# Patient Record
Sex: Male | Born: 1975 | Race: Black or African American | Hispanic: No | Marital: Single | Smoking: Current every day smoker
Health system: Southern US, Community
[De-identification: ages and names within clinical notes are randomized; demographics above are authoritative.]

## PROBLEM LIST (undated history)

## (undated) HISTORY — PX: INCISION AND DRAINAGE: SHX5863

---

## 2005-02-24 ENCOUNTER — Emergency Department (HOSPITAL_COMMUNITY): Admission: EM | Admit: 2005-02-24 | Discharge: 2005-02-24 | Payer: Self-pay | Admitting: *Deleted

## 2005-06-02 ENCOUNTER — Emergency Department (HOSPITAL_COMMUNITY): Admission: EM | Admit: 2005-06-02 | Discharge: 2005-06-02 | Payer: Self-pay | Admitting: Emergency Medicine

## 2005-09-03 ENCOUNTER — Emergency Department (HOSPITAL_COMMUNITY): Admission: EM | Admit: 2005-09-03 | Discharge: 2005-09-03 | Payer: Self-pay | Admitting: Emergency Medicine

## 2007-07-23 ENCOUNTER — Ambulatory Visit: Payer: Self-pay | Admitting: Internal Medicine

## 2007-12-16 ENCOUNTER — Emergency Department (HOSPITAL_COMMUNITY): Admission: EM | Admit: 2007-12-16 | Discharge: 2007-12-16 | Payer: Self-pay | Admitting: Emergency Medicine

## 2011-05-04 ENCOUNTER — Inpatient Hospital Stay (INDEPENDENT_AMBULATORY_CARE_PROVIDER_SITE_OTHER)
Admission: RE | Admit: 2011-05-04 | Discharge: 2011-05-04 | Disposition: A | Discharge status: Home / Self Care | Payer: Self-pay | Source: Ambulatory Visit | Attending: Family Medicine | Admitting: Family Medicine

## 2011-05-04 DIAGNOSIS — K089 Disorder of teeth and supporting structures, unspecified: Secondary | ICD-10-CM

## 2011-05-04 DIAGNOSIS — S025XXA Fracture of tooth (traumatic), initial encounter for closed fracture: Secondary | ICD-10-CM

## 2011-08-10 LAB — DIFFERENTIAL
Basophils Absolute: 0
Basophils Relative: 0
Eosinophils Absolute: 0
Eosinophils Relative: 1
Lymphocytes Relative: 3 — ABNORMAL LOW
Lymphs Abs: 0.3 — ABNORMAL LOW
Monocytes Absolute: 0.4
Monocytes Relative: 5
Neutro Abs: 7.5
Neutrophils Relative %: 91 — ABNORMAL HIGH

## 2011-08-10 LAB — CBC
HCT: 49.5
Hemoglobin: 17.2 — ABNORMAL HIGH
MCHC: 34.8
MCV: 89.3
Platelets: 198
RBC: 5.54
RDW: 14.4
WBC: 8.2

## 2011-08-10 LAB — URINALYSIS, ROUTINE W REFLEX MICROSCOPIC
Bilirubin Urine: NEGATIVE
Glucose, UA: NEGATIVE
Hgb urine dipstick: NEGATIVE
Nitrite: NEGATIVE
Protein, ur: NEGATIVE
Specific Gravity, Urine: 1.032 — ABNORMAL HIGH
Urobilinogen, UA: 0.2
pH: 7

## 2011-08-10 LAB — COMPREHENSIVE METABOLIC PANEL
ALT: 21
AST: 27
Albumin: 4.3
Alkaline Phosphatase: 80
BUN: 15
CO2: 27
Calcium: 9.3
Chloride: 97
Creatinine, Ser: 1.03
GFR calc Af Amer: >60
GFR calc non Af Amer: >60
Glucose, Bld: 125 — ABNORMAL HIGH
Potassium: 4.2
Sodium: 134 — ABNORMAL LOW
Total Bilirubin: 1
Total Protein: 7.8

## 2011-08-10 LAB — LIPASE, BLOOD: Lipase: 17

## 2012-03-18 ENCOUNTER — Emergency Department (HOSPITAL_COMMUNITY)
Admission: EM | Admit: 2012-03-18 | Discharge: 2012-03-19 | Disposition: A | Discharge status: Home / Self Care | Payer: Self-pay | Attending: Emergency Medicine | Admitting: Emergency Medicine

## 2012-03-18 ENCOUNTER — Encounter (HOSPITAL_COMMUNITY): Payer: Self-pay | Admitting: Emergency Medicine

## 2012-03-18 DIAGNOSIS — R000 Tachycardia, unspecified: Secondary | ICD-10-CM | POA: Insufficient documentation

## 2012-03-18 DIAGNOSIS — R51 Headache: Secondary | ICD-10-CM | POA: Insufficient documentation

## 2012-03-18 DIAGNOSIS — K1379 Other lesions of oral mucosa: Secondary | ICD-10-CM

## 2012-03-18 DIAGNOSIS — R221 Localized swelling, mass and lump, neck: Secondary | ICD-10-CM | POA: Insufficient documentation

## 2012-03-18 DIAGNOSIS — R1310 Dysphagia, unspecified: Secondary | ICD-10-CM | POA: Insufficient documentation

## 2012-03-18 DIAGNOSIS — R220 Localized swelling, mass and lump, head: Secondary | ICD-10-CM | POA: Insufficient documentation

## 2012-03-18 DIAGNOSIS — J029 Acute pharyngitis, unspecified: Secondary | ICD-10-CM | POA: Insufficient documentation

## 2012-03-18 DIAGNOSIS — K1370 Unspecified lesions of oral mucosa: Secondary | ICD-10-CM | POA: Insufficient documentation

## 2012-03-18 LAB — RAPID STREP SCREEN (MED CTR MEBANE ONLY): Streptococcus, Group A Screen (Direct): NEGATIVE

## 2012-03-18 MED ORDER — DEXAMETHASONE 10 MG/ML FOR PEDIATRIC ORAL USE
10.0000 mg | Freq: Once | INTRAMUSCULAR | Status: AC
  Administered 2012-03-18: 10 mg via ORAL
  Filled 2012-03-18: qty 1

## 2012-03-18 MED ORDER — OXYCODONE-ACETAMINOPHEN 5-325 MG/5ML PO SOLN
5.0000 mL | ORAL | Status: DC | PRN
  Administered 2012-03-18: 5 mL via ORAL
  Filled 2012-03-18: qty 5

## 2012-03-18 NOTE — ED Provider Notes (Addendum)
History     CSN: [id number]  Arrival date & time 03/18/12  2036   First MD Initiated Contact with Patient 03/18/12 2230      Chief Complaint  Patient presents with  . Sore Throat    (Consider location/radiation/quality/duration/timing/severity/associated sxs/prior treatment) HPI Comments: Patient has a sore throat, states it is hard for him to swallow.  His not taking any over-the-counter medication except NyQuil without relief.  Today, developed a headache, as well  Patient is a 36 y.o. male presenting with pharyngitis. The history is provided by the patient.  Sore Throat This is a new problem. The current episode started in the past 7 days. The problem occurs constantly. The problem has been unchanged. Associated symptoms include headaches and a sore throat. Pertinent negatives include no chills or fever.    No past medical history on file.  No past surgical history on file.  No family history on file.  History  Substance Use Topics  . Smoking status: Not on file  . Smokeless tobacco: Not on file  . Alcohol Use: Not on file      Review of Systems  Constitutional: Negative for fever and chills.  HENT: Positive for sore throat and trouble swallowing. Negative for ear pain and rhinorrhea.   Neurological: Positive for headaches. Negative for dizziness.    Allergies  Review of patient's allergies indicates no known allergies.  Home Medications   Current Outpatient Rx  Name Route Sig Dispense Refill  . ACETAMINOPHEN 160 MG/5ML PO SUSP Oral Take 960 mg/kg by mouth 2 (two) times daily as needed. For pain    . PRESCRIPTION MEDICATION Oral Take 5 mLs by mouth daily as needed. For pain  Tylenol 3 with codeine suspension.      BP 127/81  Pulse 105  Temp(Src) 100.6 F (38.1 C) (Oral)  Resp 20  SpO2 99%  Physical Exam  Constitutional: He appears well-developed.  HENT:  Mouth/Throat: Uvula is midline and mucous membranes are normal. Uvula swelling present. No  oropharyngeal exudate, posterior oropharyngeal edema, posterior oropharyngeal erythema or tonsillar abscesses.  Neck: Normal range of motion.  Cardiovascular: Tachycardia present.   Pulmonary/Chest: Effort normal.  Abdominal: Soft.  Musculoskeletal: Normal range of motion.  Neurological: He is alert.  Skin: Skin is warm and dry. No rash noted.    ED Course  Procedures (including critical care time)   Labs Reviewed  RAPID STREP SCREEN   No results found.   No diagnosis found.    MDM   pharyngitis with a negative strep test will try to get patient's symptoms under control by administering dexamethasone for the swelling, as well as Roxicodone liquid and then assess patient's ability to swallow  After Roxicodone and dexamethasone.  Patient is able to open his mouth fully.  Uvula is decreased in size is able to tolerate by mouth fluids without difficulty      Arman Filter, NP 03/18/12 2320  Arman Filter, NP 03/19/12 [phone]

## 2012-03-18 NOTE — ED Provider Notes (Signed)
Medical screening examination/treatment/procedure(s) were performed by non-physician practitioner and as supervising physician I was immediately available for consultation/collaboration.  Ethelda Chick, MD 03/18/12 [phone]

## 2012-03-18 NOTE — ED Notes (Signed)
Throat pain starting Saturday; febrile. Headache, body aches. PT reports difficulty eatting and swallowing. Throat white patchy and enlarged

## 2012-03-19 MED ORDER — HYDROCODONE-ACETAMINOPHEN 5-500 MG PO TABS: 1.0000 | ORAL_TABLET | Freq: Four times a day (QID) | ORAL | Status: AC | PRN

## 2012-03-19 MED ORDER — AZITHROMYCIN 250 MG PO TABS: 250.0000 mg | ORAL_TABLET | Freq: Every day | ORAL | Status: AC

## 2012-03-19 MED ORDER — AZITHROMYCIN 250 MG PO TABS
500.0000 mg | ORAL_TABLET | Freq: Once | ORAL | Status: AC
  Administered 2012-03-19: 500 mg via ORAL
  Filled 2012-03-19: qty 2

## 2012-03-19 NOTE — ED Provider Notes (Signed)
Medical screening examination/treatment/procedure(s) were performed by non-physician practitioner and as supervising physician I was immediately available for consultation/collaboration.  Sunnie Nielsen, MD 03/19/12 [phone]

## 2012-03-19 NOTE — Discharge Instructions (Signed)
Pharyngitis, Viral and Bacterial Pharyngitis is soreness (inflammation) or infection of the pharynx. It is also called a sore throat. CAUSES  Most sore throats are caused by viruses and are part of a cold. However, some sore throats are caused by strep and other bacteria. Sore throats can also be caused by post nasal drip from draining sinuses, allergies and sometimes from sleeping with an open mouth. Infectious sore throats can be spread from person to person by coughing, sneezing and sharing cups or eating utensils. TREATMENT  Sore throats that are viral usually last 3-4 days. Viral illness will get better without medications (antibiotics). Strep throat and other bacterial infections will usually begin to get better about 24-48 hours after you begin to take antibiotics. HOME CARE INSTRUCTIONS   If the caregiver feels there is a bacterial infection or if there is a positive strep test, they will prescribe an antibiotic. The full course of antibiotics must be taken. If the full course of antibiotic is not taken, you or your child may become ill again. If you or your child has strep throat and do not finish all of the medication, serious heart or kidney diseases may develop.   Drink enough water and fluids to keep your urine clear or pale yellow.   Only take over-the-counter or prescription medicines for pain, discomfort or fever as directed by your caregiver.   Get lots of rest.   Gargle with salt water ( tsp. of salt in a glass of water) as often as every 1-2 hours as you need for comfort.   Hard candies may soothe the throat if individual is not at risk for choking. Throat sprays or lozenges may also be used.  SEEK MEDICAL CARE IF:   Large, tender lumps in the neck develop.   A rash develops.   Green, yellow-brown or bloody sputum is coughed up.   Your baby is older than 3 months with a rectal temperature of 100.5 F (38.1 C) or higher for more than 1 day.  SEEK IMMEDIATE MEDICAL CARE  IF:   A stiff neck develops.   You or your child are drooling or unable to swallow liquids.   You or your child are vomiting, unable to keep medications or liquids down.   You or your child has severe pain, unrelieved with recommended medications.   You or your child are having difficulty breathing (not due to stuffy nose).   You or your child are unable to fully open your mouth.   You or your child develop redness, swelling, or severe pain anywhere on the neck.   You have a fever.   Your baby is older than 3 months with a rectal temperature of 102 F (38.9 C) or higher.   Your baby is 3 months old or younger with a rectal temperature of 100.4 F (38 C) or higher.  MAKE SURE YOU:   Understand these instructions.   Will watch your condition.   Will get help right away if you are not doing well or get worse.  Document Released: 11/06/2005 Document Revised: 10/26/2011 Document Reviewed: 02/03/2008 [employer] Patient Information 2012 [employer]Salt Water Gargle This solution will help make your mouth and throat feel better. HOME CARE INSTRUCTIONS   Mix 1 teaspoon of salt in 8 ounces of warm water.   Gargle with this solution as much or often as you need or as directed. Swish and gargle gently if you have any sores or wounds in your mouth.   Do not   swallow this mixture.  Document Released: 08/10/2004 Document Revised: 10/26/2011 Document Reviewed: 01/01/2009 [facility] Patient Information 2012 [place], [place]. Your rapid strep test was negative, but is being cultured, started on antibiotic.  Please take as directed.  We will not need any further medication for swelling, try to stay dehydrated, sipping cool liquids frequently

## 2012-10-28 ENCOUNTER — Emergency Department (HOSPITAL_COMMUNITY): Payer: No Typology Code available for payment source

## 2012-10-28 ENCOUNTER — Encounter (HOSPITAL_COMMUNITY): Payer: Self-pay | Admitting: *Deleted

## 2012-10-28 ENCOUNTER — Emergency Department (HOSPITAL_COMMUNITY)
Admission: EM | Admit: 2012-10-28 | Discharge: 2012-10-28 | Disposition: A | Discharge status: Home / Self Care | Payer: No Typology Code available for payment source | Attending: Emergency Medicine | Admitting: Emergency Medicine

## 2012-10-28 DIAGNOSIS — Y9389 Activity, other specified: Secondary | ICD-10-CM | POA: Insufficient documentation

## 2012-10-28 DIAGNOSIS — F17200 Nicotine dependence, unspecified, uncomplicated: Secondary | ICD-10-CM | POA: Insufficient documentation

## 2012-10-28 DIAGNOSIS — S139XXA Sprain of joints and ligaments of unspecified parts of neck, initial encounter: Secondary | ICD-10-CM | POA: Insufficient documentation

## 2012-10-28 DIAGNOSIS — S161XXA Strain of muscle, fascia and tendon at neck level, initial encounter: Secondary | ICD-10-CM

## 2012-10-28 DIAGNOSIS — Y92410 Unspecified street and highway as the place of occurrence of the external cause: Secondary | ICD-10-CM | POA: Insufficient documentation

## 2012-10-28 DIAGNOSIS — S39012A Strain of muscle, fascia and tendon of lower back, initial encounter: Secondary | ICD-10-CM

## 2012-10-28 DIAGNOSIS — S335XXA Sprain of ligaments of lumbar spine, initial encounter: Secondary | ICD-10-CM | POA: Insufficient documentation

## 2012-10-28 LAB — POCT I-STAT, CHEM 8
BUN: 11 mg/dL (ref 6–23)
Calcium, Ion: 1.16 mmol/L (ref 1.12–1.23)
Chloride: 108 mEq/L (ref 96–112)
Creatinine, Ser: 0.9 mg/dL (ref 0.50–1.35)
Glucose, Bld: 108 mg/dL — ABNORMAL HIGH (ref 70–99)
HCT: 45 % (ref 39.0–52.0)
Hemoglobin: 15.3 g/dL (ref 13.0–17.0)
Potassium: 4.1 mEq/L (ref 3.5–5.1)
Sodium: 142 mEq/L (ref 135–145)
TCO2: 25 mmol/L (ref 0–100)

## 2012-10-28 MED ORDER — HYDROCODONE-ACETAMINOPHEN 5-325 MG PO TABS: 1.0000 | ORAL_TABLET | Freq: Four times a day (QID) | ORAL | Status: DC | PRN

## 2012-10-28 MED ORDER — MORPHINE SULFATE 4 MG/ML IJ SOLN
4.0000 mg | Freq: Once | INTRAMUSCULAR | Status: AC
  Administered 2012-10-28: 4 mg via INTRAVENOUS
  Filled 2012-10-28: qty 1

## 2012-10-28 MED ORDER — IOHEXOL 300 MG/ML  SOLN
100.0000 mL | Freq: Once | INTRAMUSCULAR | Status: AC | PRN
  Administered 2012-10-28: 100 mL via INTRAVENOUS

## 2012-10-28 MED ORDER — NAPROXEN 500 MG PO TABS: 500.0000 mg | ORAL_TABLET | Freq: Two times a day (BID) | ORAL | Status: DC

## 2012-10-28 MED ORDER — SODIUM CHLORIDE 0.9 % IV SOLN
INTRAVENOUS | Status: DC
  Administered 2012-10-28: 125 mL/h via INTRAVENOUS
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
## 2012-10-28 NOTE — Progress Notes (Signed)
Pt listed with no insurance coverage CM and [facility] liaison spoke with him Pt offered services to assist with finding a [place] self pay provider & health reform information 
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
## 2015-07-21 ENCOUNTER — Ambulatory Visit: Payer: Self-pay | Attending: Chiropractic Medicine | Admitting: Physical Therapy

## 2015-09-20 ENCOUNTER — Ambulatory Visit: Payer: Self-pay | Attending: Chiropractic Medicine | Admitting: Physical Therapy

## 2015-09-20 ENCOUNTER — Encounter: Payer: Self-pay | Admitting: Physical Therapy

## 2015-09-20 DIAGNOSIS — M545 Low back pain, unspecified: Secondary | ICD-10-CM
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
## 2015-09-20 NOTE — Patient Instructions (Signed)
Knee-to-Chest: with Neck Flexion Stretch (Supine)   Pull left knee to chest, tucking chin and lifting head. Hold __10__ seconds. Relax. Repeat _10___ times per set. Do _2___ sets per session. Do __2__ sessions per day.  Trunk: Knees to Chest   Lie on firm, flat surface. Keep head and shoulders flat on surface. Tuck hands behind knees and pull to chest. Hold _10___ seconds. Repeat __10__ times. Do _2___ sessions per day. CAUTION: Movement should be gentle and slow.  Caudal Rotation: Hip Roll, Neutral Lordosis - Supine   Lie with knees bent and slightly elevated, feet flat. Tighten stomach, lower knees out to right side, rotating hips and trunk. Keep stomach tight for return. Repeat _10___ times per set. Do __2__ sets per session. Do _2___ sessions per week.  Pelvic Tilt: Anterior - Legs Bent (Supine)   Rotate pelvis up and arch back. Hold ____ seconds. Relax. Repeat ____ times per set. Do ____ sets per session. Do ____ sessions per day.  Piriformis Stretch   Lying on back, pull right knee toward opposite shoulder. Hold __30__ seconds. Repeat _4___ times. Do _2___ sessions per day.  Back Hyperextension: Using Arms   Lying face down with arms bent, inhale. Then while exhaling, straighten arms. Hold __2__ seconds. Let hips sag toward floor.  Slowly return to starting position. Repeat _10___ times per set. Do _2___ sets per session. Do __2__ sessions per day.  Back Namaste   Stand, feet in stride stance. Rotate back leg out about 20, keeping heel on floor. Stand, hands on low back. Press hips forward and arch back. Hold _2__ seconds. Repeat 10___ times per session. Do _2__ sessions per day.  Bracing With Bridging (Hook-Lying)   With neutral spine, tighten pelvic floor and abdominals and hold. Lift bottom. Repeat _10__ times. Do _2__ times a day.

## 2015-09-27 ENCOUNTER — Ambulatory Visit: Payer: Self-pay | Attending: Chiropractic Medicine | Admitting: Physical Therapy

## 2022-06-06 ENCOUNTER — Encounter: Payer: Self-pay | Admitting: Nurse Practitioner

## 2022-06-06 ENCOUNTER — Ambulatory Visit (INDEPENDENT_AMBULATORY_CARE_PROVIDER_SITE_OTHER): Payer: Commercial Managed Care - HMO | Admitting: Nurse Practitioner

## 2022-06-06 VITALS — BP 131/84 | HR 98 | Temp 98.2°F | Ht 69.0 in | Wt 282.1 lb

## 2022-06-06 DIAGNOSIS — Z7689 Persons encountering health services in other specified circumstances: Secondary | ICD-10-CM | POA: Diagnosis not present

## 2022-06-06 DIAGNOSIS — M5136 Other intervertebral disc degeneration, lumbar region: Secondary | ICD-10-CM

## 2022-06-06 MED ORDER — METHOCARBAMOL 500 MG PO TABS: 500.0000 mg | ORAL_TABLET | Freq: Three times a day (TID) | ORAL | 1 refills | Status: DC | PRN

## 2022-06-06 NOTE — Progress Notes (Signed)
New Patient Office Visit  Subjective    Patient ID: Shannon Alexander, male    DOB: [birth year]  Age: 46 y.o. MRN: [id number]  CC:  Chief Complaint  Patient presents with   New Patient (Initial Visit)    HPI Shannon Alexander presents to establish care The patient has not had primary care in the past.  -had been seeing orthopedics/back specialist. Has bulging disc in lumbar spine. Was told he needed to have surgery. Did not have insurance at the time. Now that he does, place where he was going does not take his insurance. Bending and lifting make pain much worse.  -due to have routine fasting labs.  -due to have CPE  Outpatient Encounter Medications as of 06/06/2022  Medication Sig   methocarbamol (ROBAXIN) 500 MG tablet Take 1 tablet (500 mg total) by mouth every 8 (eight) hours as needed for muscle spasms.   [DISCONTINUED] HYDROcodone-acetaminophen (NORCO) 5-325 MG per tablet Take 1-2 tablets by mouth every 6 (six) hours as needed for pain.   [DISCONTINUED] naproxen (NAPROSYN) 500 MG tablet Take 1 tablet (500 mg total) by mouth 2 (two) times daily.   No facility-administered encounter medications on file as of 06/06/2022.    History reviewed. No pertinent past medical history.  History reviewed. No pertinent surgical history.  History reviewed. No pertinent family history.  Social History   Socioeconomic History   Marital status: Single    Spouse name: Not on file   Number of children: Not on file   Years of education: Not on file   Highest education level: Not on file  Occupational History   Not on file  Tobacco Use   Smoking status: Every Day    Packs/day: 0.50    Years: 15.00    Total pack years: 7.50    Types: Cigarettes   Smokeless tobacco: Not on file  Substance and Sexual Activity   Alcohol use: No   Drug use: No   Sexual activity: Not Currently    Birth control/protection: None  Other Topics Concern   Not on file  Social History Narrative   Not on file    Social Determinants of Health   Financial Resource Strain: Not on file  Food Insecurity: Not on file  Transportation Needs: Not on file  Physical Activity: Not on file  Stress: Not on file  Social Connections: Not on file  Intimate Partner Violence: Not on file    Review of Systems  Constitutional:  Negative for chills, fever and malaise/fatigue.  HENT:  Negative for congestion, sinus pain and sore throat.   Eyes: Negative.   Respiratory:  Negative for cough, shortness of breath and wheezing.   Cardiovascular:  Negative for chest pain, palpitations and leg swelling.  Gastrointestinal:  Negative for constipation, diarrhea, nausea and vomiting.  Genitourinary: Negative.   Musculoskeletal:  Positive for back pain, joint pain and myalgias.  Skin: Negative.   Neurological:  Negative for dizziness and headaches.  Endo/Heme/Allergies:  Does not bruise/bleed easily.  Psychiatric/Behavioral:  Negative for depression. The patient is not nervous/anxious.         Objective    Today's Vitals   06/06/22 1130  BP: 131/84  Pulse: 98  Temp: 98.2 F (36.8 C)  SpO2: 97%  Weight: 282 lb 1.9 oz (128 kg)  Height: 5\' 9"  (1.753 m)   Body mass index is 41.66 kg/m.   Physical Exam Vitals and nursing note reviewed.  Constitutional:      Appearance: Normal appearance.  He is well-developed.  HENT:     Head: Normocephalic and atraumatic.     Nose: Nose normal.     Mouth/Throat:     Mouth: Mucous membranes are moist.     Pharynx: Oropharynx is clear.  Eyes:     Extraocular Movements: Extraocular movements intact.     Conjunctiva/sclera: Conjunctivae normal.     Pupils: Pupils are equal, round, and reactive to light.  Cardiovascular:     Rate and Rhythm: Normal rate and regular rhythm.     Pulses: Normal pulses.     Heart sounds: Normal heart sounds.  Pulmonary:     Effort: Pulmonary effort is normal.     Breath sounds: Normal breath sounds.  Abdominal:     Palpations: Abdomen  is soft.  Musculoskeletal:        General: Normal range of motion.     Cervical back: Normal range of motion and neck supple.     Comments: Moderate lower back pain which radiates into the hips and legs. No bony abnormalities or  deformities are noted at this time.   Lymphadenopathy:     Cervical: No cervical adenopathy.  Skin:    General: Skin is warm and dry.     Capillary Refill: Capillary refill takes less than 2 seconds.  Neurological:     General: No focal deficit present.     Mental Status: He is alert and oriented to person, place, and time.  Psychiatric:        Mood and Affect: Mood normal.        Behavior: Behavior normal.        Thought Content: Thought content normal.        Judgment: Judgment normal.         Assessment & Plan:  1. Bulging of lumbar intervertebral disc Patient with known history of bulging disc in lumbar spine.  Encouraged him to take ibuprofen and/or Tylenol as needed as indicated.  Add Robaxin 500 mg up to 3 times daily as needed to reduce muscle pain.  Referral to new orthopedic provider made today. - Ambulatory referral to Orthopedic Surgery - methocarbamol (ROBAXIN) 500 MG tablet; Take 1 tablet (500 mg total) by mouth every 8 (eight) hours as needed for muscle spasms.  Dispense: 90 tablet; Refill: 1  2. Encounter to establish care Appointment today to establish new primary care provider      Problem List Items Addressed This Visit   None Visit Diagnoses     Bulging of lumbar intervertebral disc    -  Primary   Relevant Medications   methocarbamol (ROBAXIN) 500 MG tablet   Other Relevant Orders   Ambulatory referral to Orthopedic Surgery   Encounter to establish care           Return in about 6 weeks (around 07/18/2022) for health maintenance exam, FBW at time of visit.   Carlean Jews, NP

## 2022-06-15 ENCOUNTER — Ambulatory Visit (INDEPENDENT_AMBULATORY_CARE_PROVIDER_SITE_OTHER): Payer: Commercial Managed Care - HMO

## 2022-06-15 ENCOUNTER — Ambulatory Visit: Payer: Self-pay

## 2022-06-15 ENCOUNTER — Encounter: Payer: Self-pay | Admitting: Surgery

## 2022-06-15 ENCOUNTER — Ambulatory Visit (INDEPENDENT_AMBULATORY_CARE_PROVIDER_SITE_OTHER): Payer: Commercial Managed Care - HMO | Admitting: Surgery

## 2022-06-15 DIAGNOSIS — R2681 Unsteadiness on feet: Secondary | ICD-10-CM

## 2022-06-15 DIAGNOSIS — M545 Low back pain, unspecified: Secondary | ICD-10-CM

## 2022-06-15 DIAGNOSIS — M4712 Other spondylosis with myelopathy, cervical region: Secondary | ICD-10-CM | POA: Diagnosis not present

## 2022-06-15 DIAGNOSIS — R29898 Other symptoms and signs involving the musculoskeletal system: Secondary | ICD-10-CM

## 2022-06-15 DIAGNOSIS — G8929 Other chronic pain: Secondary | ICD-10-CM

## 2022-06-15 NOTE — Progress Notes (Addendum)
Office Visit Note   Patient: Shannon Alexander           Date of Birth: 02/11/1976           MRN: [id number] Visit Date: 06/15/2022              Requested by: Carlean Jews, NP [address] [place],  [place] [address] PCP: Carlean Jews, NP   Assessment & Plan: Visit Diagnoses:  1. Chronic bilateral low back pain without sciatica   2. Other spondylosis with myelopathy, cervical region   3. Unsteady gait when walking   4. Weakness of left leg   Abnormal spinal cord signal at C3-4 and C4-5. 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  Again the study did show findings of cervical myelopathy.  With his back pain I will also get lumbar MRI to see if there is a contributing factor there as well.  Follow-up with Dr. Ophelia Charter in a couple weeks for recheck and to discuss results and further treatment options.  Advised patient that ultimately he will likely come down to him needing surgical intervention for his cervical spine as previously recommended by the spine surgeon last year.  Follow-Up Instructions: Return in about 2 weeks (around 06/29/2022) for with dr yates to review cervical and lumbar mri scans.   Orders:  Orders Placed This Encounter  Procedures   XR Lumbar Spine 2-3 Views   XR Cervical Spine 2 or 3 views   MR Cervical Spine w/o contrast   MR Lumbar Spine w/o contrast   No orders of the defined types were placed in this encounter.     Procedures: No procedures performed   Clinical Data: No additional findings.   Subjective: Chief Complaint  Patient presents with   Lower Back - Pain    HPI 46 year old black male who is new  patient to clinic comes in today with complaints of chronic neck pain, low back pain and worsening unsteady gait and left lower extremity weakness.  I reviewed patient's chart and he was seen at [facility] last year by an orthopedic spine specialist.  Patient had cervical and thoracic spine MRI December 20, 2020 and that study showed:  CLINICAL DATA:  Progressive left-sided weakness for 1 month.   EXAM:  MRI CERVICAL AND THORACIC SPINE WITHOUT AND WITH CONTRAST   TECHNIQUE:  Multiplanar and multiecho pulse sequences of the cervical spine, to  include the craniocervical junction and cervicothoracic junction,  and the thoracic spine, were obtained without and with intravenous  contrast.   CONTRAST:  10 mL Gadavist   COMPARISON:  Cervical spine CT 10/28/2012. Cervical and thoracic  spine radiographs 05/28/2015.   FINDINGS:  MRI CERVICAL SPINE FINDINGS   Alignment: Straightening of the normal cervical lordosis. No  listhesis.   Vertebrae: No fracture or suspicious osseous lesion. Prominent Modic  type 2 degenerative marrow changes at C6-7 and mild to moderate  Modic type 1 changes at C4-5.   Cord: Prominent T2 hyperintensity in the spinal cord bilaterally at  C4-5 and on the right greater than left at C3-4. No definite  associated abnormal enhancement although postcontrast imaging is  moderately motion degraded. Normal cord signal and morphology  elsewhere.   Posterior Fossa, vertebral arteries, paraspinal tissues: 7 mm T2  hyperintense enhancing nodule posteriorly in the superficial aspect  of the left parotid gland. Preserved vertebral artery flow voids.  Posterior fossa more fully evaluated on separate brain MRI.   Disc levels:   Diffuse narrowing of the cervical spinal canal on a congenital  basis.   C2-3: Mild left uncovertebral spurring results in mild left neural  foraminal stenosis without spinal stenosis.   C3-4: Disc bulging and a left foraminal disc osteophyte  complex  result in mild spinal stenosis and moderate to severe left neural  foraminal stenosis.   C4-5: A central disc extrusion results in severe spinal stenosis  with moderate ventral cord flattening. Disc bulging and  uncovertebral spurring result in mild-to-moderate bilateral neural  foraminal stenosis.   C5-6: Mild disc bulging and uncovertebral spurring result in mild  spinal stenosis and severe bilateral neural foraminal stenosis.   C6-7: Disc bulging and uncovertebral spurring result in mild spinal  stenosis and moderate bilateral neural foraminal stenosis.   C7-T1: Disc bulging and a left paracentral to medial left foraminal  disc osteophyte complex result in mild left neural foraminal  stenosis without spinal stenosis.   MRI THORACIC SPINE FINDINGS   Alignment:  Normal.   Vertebrae: No fracture, suspicious osseous lesion, or significant  marrow edema.   Cord: No cord signal abnormality identified within limitations of  mild motion artifact. No abnormal intradural enhancement.   Paraspinal and other soft tissues: Unremarkable.   Disc levels: Small central disc protrusions at T1-2 and T6-7, a  small left paracentral disc protrusion at T5-6, and a small right  paracentral disc protrusion at T7-8 do not result in significant  spinal stenosis or spinal cord mass effect. A small central disc  protrusion at T8-9 results in mild spinal stenosis and focally  indents the spinal cord. There is mild facet arthrosis in the lower  thoracic spine without evidence of a compressive neural foraminal  stenosis.   IMPRESSION:  1. Abnormal spinal cord signal at C3-4 and C4-5. 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 No definite  abnormal enhancement or abnormal cord signal elsewhere to strongly  suggest a more diffuse process such as demyelinating disease or  infectious or  inflammatory myelitis.  2. Mild spinal stenosis at C3-4, C5-6, and C6-7.  3. Moderate to severe left neural foraminal stenosis at C3-4 and  severe bilateral neural foraminal stenosis at C5-6.  4. Multiple small thoracic disc protrusions with mild spinal  stenosis at T8-9.    Electronically Signed    By: Sebastian Ache M.D.    On: 12/20/2020 13:46 Procedure Note  Prudy Feeler, MD - 12/20/2020  Formatting of this note might be different from the original.   The physician and recommended doing a possible two-level cervical fusion at C3-4 and C4-5 due to the finding of the abnormal cord signal at C3-4 and C4-5.  Patient states that he did not have the procedure due to issues with his insurance and there is also another personal matter that prevented him from having the surgery.  He states that he is continue to have neck pain and constant numbness in his left hand.  One of his biggest problems is ongoing unsteady gait and left leg weakness.  States that when he walks his balance is  off.  Has a hard time flexing his left hip when he is ambulating.  States that he also has episodes where his legs shake.  Back pain ongoing for the few months.  No injury.  He denies arm pain or leg pain.   Review of Systems No current cardiopulmonary GI/GU issue  Objective: Vital Signs: There were no vitals taken for this visit.  Physical Exam HENT:     Head: Normocephalic and atraumatic.     Nose: Nose normal.  Eyes:     Extraocular Movements: Extraocular movements intact.  Pulmonary:     Effort: No respiratory distress.  Musculoskeletal:     Comments: Gait is ataxic.  Good cervical spine range of motion.  Positive bilateral brachial plexus and trapezius tenderness.  Trace bilateral triceps weakness.  Patient has left greater than right hip flexor weakness with resistance.  Negative logroll bilateral hips.  Left anterior tib weakness.  Positive bilateral ankle clonus.  Neurological:     Mental Status: He  is alert.  Psychiatric:        Mood and Affect: Mood normal.     Ortho Exam  Specialty Comments:  No specialty comments available.  Imaging: XR Cervical Spine 2 or 3 views  Result Date: 06/15/2022 X-ray cervical spine shows multilevel cervical spondylosis most significant at C4-C7 with disc base collapse and vertebral spurs.  Straightening of the normal cervical lordosis.    PMFS History: There are no problems to display for this patient.  History reviewed. No pertinent past medical history.  History reviewed. No pertinent family history.  History reviewed. No pertinent surgical history. Social History   Occupational History   Not on file  Tobacco Use   Smoking status: Every Day    Packs/day: 0.50    Years: 15.00    Total pack years: 7.50    Types: Cigarettes   Smokeless tobacco: Not on file  Substance and Sexual Activity   Alcohol use: No   Drug use: No   Sexual activity: Not Currently    Birth control/protection: None

## 2022-06-20 ENCOUNTER — Telehealth: Payer: Self-pay | Admitting: Surgery

## 2022-06-20 NOTE — Telephone Encounter (Signed)
Pt called requesting a call back from PA [place]. Pt state he was approved for one MRI but nor the other and states PA Fayrene Fearing told pt to him if he had any problems. Please call pt at [phone].

## 2022-06-21 ENCOUNTER — Ambulatory Visit
Admission: RE | Admit: 2022-06-21 | Discharge: 2022-06-21 | Disposition: A | Discharge status: Home / Self Care | Payer: Commercial Managed Care - HMO | Source: Ambulatory Visit | Attending: Surgery | Admitting: Surgery

## 2022-06-21 DIAGNOSIS — M4712 Other spondylosis with myelopathy, cervical region: Secondary | ICD-10-CM

## 2022-06-21 NOTE — Telephone Encounter (Signed)
I called and advised the pt as per james. To proceed with c-spine. He stated understanding

## 2022-06-21 NOTE — Telephone Encounter (Signed)
Do you know what is going on with the other mri before I call the pt

## 2022-06-30 ENCOUNTER — Encounter: Payer: Self-pay | Admitting: Orthopaedic Surgery

## 2022-06-30 ENCOUNTER — Ambulatory Visit (INDEPENDENT_AMBULATORY_CARE_PROVIDER_SITE_OTHER): Payer: Commercial Managed Care - HMO | Admitting: Orthopaedic Surgery

## 2022-06-30 DIAGNOSIS — M4712 Other spondylosis with myelopathy, cervical region: Secondary | ICD-10-CM | POA: Diagnosis not present

## 2022-06-30 DIAGNOSIS — M4802 Spinal stenosis, cervical region: Secondary | ICD-10-CM

## 2022-07-03 ENCOUNTER — Encounter: Payer: Self-pay | Admitting: Orthopaedic Surgery

## 2022-07-03 DIAGNOSIS — M4802 Spinal stenosis, cervical region: Secondary | ICD-10-CM | POA: Insufficient documentation

## 2022-07-03 DIAGNOSIS — M4712 Other spondylosis with myelopathy, cervical region: Secondary | ICD-10-CM

## 2022-07-03 NOTE — Progress Notes (Signed)
Office Visit Note   Patient: Shannon Alexander           Date of Birth: 1976-10-12           MRN: [id number] Visit Date: 06/30/2022              Requested by: Ronnell Freshwater, NP [place],  [place] [address] PCP: Ronnell Freshwater, NP   Assessment & Plan: Visit Diagnoses:  1. Spinal stenosis of cervical region   2. Other spondylosis with myelopathy, cervical region     Plan: Cervical MRI scan reviewed.  Patient been staying with his mother.  He will require two-level cervical fusion C3-4 C4-5 with stenosis and myelopathic changes in the cord.  Long discussion with the patient and that surgery is to help prevent progression of the problem and that there is already permanent damage in the cord which likely will not improve with the surgery.  Procedure discussed postoperative immobilization with collar discussed.  Risks of dysphagia dysphonia, pseudoarthrosis, reoperation, progression of disc degeneration at other levels discussed.  Questions were elicited and answered he understands request we proceed.  Follow-Up Instructions: No follow-ups on file.   Orders:  No orders of the defined types were placed in this encounter.  No orders of the defined types were placed in this encounter.     Procedures: No procedures performed   Clinical Data: No additional findings.   Subjective: Chief Complaint  Patient presents with   Neck - Pain, Follow-up    MRI cervical spine review    HPI 46 year old male with chronic neck pain, gait disturbance and numbness in his hands.  Patient states he has been limping and trouble walking and more problems with left lower extremity weakness and left arm and right-sided weakness.  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  He was seen January and February 2022 at [facility] and two-level cervical fusion C3-4 C4-5 was recommended for his cervical stenosis with myelopathic changes in his cord and patient states he  did not have insurance at that time and despite recommendations did not schedule surgery.  He has had persistent problems with walking limping and numbness.  At [facility] he had a negative MRA head and negative MRI neck.  Review of Systems positive for smoking no history of cardiac problems or stroke.  Positive for ataxic gait and more left arm left leg weakness.   Objective: Vital Signs: BP 121/80   Pulse 93   Ht 5\' 9"  (1.753 m)   Wt 282 lb (127.9 kg)   BMI 41.64 kg/m   Physical Exam Constitutional:      Appearance: He is well-developed.  HENT:     Head: Normocephalic and atraumatic.     Right Ear: External ear normal.     Left Ear: External ear normal.  Eyes:     Pupils: Pupils are equal, round, and reactive to light.  Neck:     Thyroid: No thyromegaly.     Trachea: No tracheal deviation.  Cardiovascular:     Rate and Rhythm: Normal rate.  Pulmonary:     Effort: Pulmonary effort is normal.     Breath sounds: No wheezing.  Abdominal:     General: Bowel sounds are normal.     Palpations: Abdomen is soft.  Musculoskeletal:     Cervical back: Neck supple.  Skin:    General: Skin is warm and dry.     Capillary Refill: Capillary  refill takes less than 2 seconds.  Neurological:     Mental Status: He is alert and oriented to person, place, and time.  Psychiatric:        Behavior: Behavior normal.        Thought Content: Thought content normal.        Judgment: Judgment normal.     Ortho Exam patient has decreased biceps triceps on the left.  Ataxic gait slightly wide stance with 20 beat clonus on the left ongoing and less sustained clonus on the right.  4+ knee jerk.  Negative logroll of the hips.  Slight quad weakness on the left good on the right.  Specialty Comments:  No specialty comments available.  Imaging: Narrative & Impression  CLINICAL DATA:  Neck pain, left leg weakness with low back pain and left hand numbness   EXAM: MRI CERVICAL SPINE WITHOUT CONTRAST    TECHNIQUE: Multiplanar, multisequence MR imaging of the cervical spine was performed. No intravenous contrast was administered.   COMPARISON:  12/20/2020   FINDINGS: Alignment: Straightening of the normal cervical lordosis. No listhesis.   Vertebrae: No acute fracture or suspicious osseous lesion. Redemonstrated Modic type 2 degenerative changes at C6 and C7, with increased Modic type 1 degenerative changes in C4 and C5. Congenitally short pedicles, which narrow the AP diameter of the spinal canal.   Cord: Redemonstrated T2 hyperintense signal in the spinal cord bilaterally C4-C5 and right-greater-than-left at C3-C4, which appears unchanged, although the caliber of the cord appears slightly diminished in these locations compared to the prior exam. No new areas of T2 hyperintense signal. Otherwise normal signal and morphology in the cervical spinal cord.   Posterior Fossa, vertebral arteries, paraspinal tissues: Previously noted cystic lesion in the left parotid gland is no longer definitively seen. Otherwise negative.   Disc levels:   C2-C3: No significant disc bulge. Left uncovertebral hypertrophy. No spinal canal stenosis. Mild left neural foraminal narrowing.   C3-C4: Minimal disc bulge. Left uncovertebral hypertrophy. Ligamentum flavum hypertrophy. Mild spinal canal stenosis, with moderate to severe left neural foraminal narrowing, unchanged.   C4-C5: Central disc protrusion. Facet and uncovertebral hypertrophy. Moderate to severe spinal canal stenosis and moderate bilateral neural foraminal narrowing, unchanged.   C5-C6: Mild disc bulge. Facet and uncovertebral hypertrophy. Mild-to-moderate spinal canal stenosis with severe right and moderate to severe left neural foraminal narrowing, unchanged.   C6-C7: Minimal disc bulge. Facet and uncovertebral hypertrophy. Mild-to-moderate spinal canal stenosis with moderate right and mild left neural foraminal narrowing,  unchanged.   C7-T1: Mild disc bulge with left paracentral/subarticular disc protrusion. Mild left neural foraminal narrowing. No spinal canal stenosis.   IMPRESSION: 1. 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 No new areas of abnormal cord signal. 2. 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 3. Multilevel uncovertebral and facet arthropathy, which is worst at C5-C6, where it is severe on the right and moderate to severe on the left. Additional lesser neural foraminal narrowing, as described above.     Electronically Signed   By: Wiliam Ke M.D.   On: 06/21/2022 18:45     PMFS History: Patient Active Problem List   Diagnosis Date Noted   Spinal stenosis of cervical region 07/03/2022   Other spondylosis with myelopathy, cervical region 07/03/2022   Past Medical History:  Diagnosis Date   Other spondylosis with  myelopathy, cervical region 07/03/2022    No family history on file.  No past surgical history on file. Social History   Occupational History   Not on file  Tobacco Use   Smoking status: Every Day    Packs/day: 0.50    Years: 15.00    Total pack years: 7.50    Types: Cigarettes   Smokeless tobacco: Not on file  Substance and Sexual Activity   Alcohol use: No   Drug use: No   Sexual activity: Not Currently    Birth control/protection: None

## 2022-07-04 ENCOUNTER — Encounter: Payer: Self-pay | Admitting: Surgery

## 2022-07-04 ENCOUNTER — Ambulatory Visit: Payer: Commercial Managed Care - HMO | Admitting: Surgery

## 2022-07-04 VITALS — BP 121/86 | HR 84 | Temp 98.6°F

## 2022-07-04 DIAGNOSIS — R2681 Unsteadiness on feet: Secondary | ICD-10-CM

## 2022-07-04 DIAGNOSIS — Z01818 Encounter for other preprocedural examination: Secondary | ICD-10-CM

## 2022-07-04 DIAGNOSIS — M4802 Spinal stenosis, cervical region: Secondary | ICD-10-CM

## 2022-07-04 DIAGNOSIS — R29898 Other symptoms and signs involving the musculoskeletal system: Secondary | ICD-10-CM

## 2022-07-07 NOTE — Progress Notes (Signed)
Surgical Instructions    Your procedure is scheduled on Monday August 28th.  Report to [facility] Main Entrance "A" at 8 A.M., then check in with the Admitting office.  Call this number if you have problems the morning of surgery:  [phone]   If you have any questions prior to your surgery date call [phone]: Open Monday-Friday 8am-4pm    Remember:  Do not eat after midnight the night before your surgery  You may drink clear liquids until 7am the morning of your surgery.   Clear liquids allowed are: Water, Non-Citrus Juices (without pulp), Carbonated Beverages, Clear Tea, Black Coffee ONLY (NO MILK, CREAM OR POWDERED CREAMER of any kind), and Gatorade    Take these medicines the morning of surgery with A SIP OF WATER: NONE   As of today, STOP taking any Aspirin (unless otherwise instructed by your surgeon) Aleve, Naproxen, Ibuprofen, Motrin, Advil, Goody's, BC's, all herbal medications, fish oil, and all vitamins.           Do not wear jewelry . Do not wear lotions, powders, cologne or deodorant. Do not shave 48 hours prior to surgery.  Men may shave face and neck. Do not bring valuables to the hospital. Do not wear nail polish  [place] is not responsible for any belongings or valuables.    Do NOT Smoke (Tobacco/Vaping)  24 hours prior to your procedure  If you use a CPAP at night, you may bring your mask for your overnight stay.   Contacts, glasses, hearing aids, dentures or partials may not be worn into surgery, please bring cases for these belongings   For patients admitted to the hospital, discharge time will be determined by your treatment team.   Patients discharged the day of surgery will not be allowed to drive home, and someone needs to stay with them for 24 hours.   SURGICAL WAITING ROOM VISITATION Patients having surgery or a procedure may have no more than 2 support people in the waiting area - these visitors may rotate.   Children under the age of  71 must have an adult with them who is not the patient. If the patient needs to stay at the hospital during part of their recovery, the visitor guidelines for inpatient rooms apply. Pre-op nurse will coordinate an appropriate time for 1 support person to accompany patient in pre-op.  This support person may not rotate.   Please refer to the [facility] website for the visitor guidelines for Inpatients (after your surgery is over and you are in a regular room).    Special instructions:    Oral Hygiene is also important to reduce your risk of infection.  Remember - BRUSH YOUR TEETH THE MORNING OF SURGERY WITH YOUR REGULAR TOOTHPASTE   [place]- Preparing For Surgery  Before surgery, you can play an important role. Because skin is not sterile, your skin needs to be as free of germs as possible. You can reduce the number of germs on your skin by washing with CHG (chlorahexidine gluconate) Soap before surgery.  CHG is an antiseptic cleaner which kills germs and bonds with the skin to continue killing germs even after washing.     Please do not use if you have an allergy to CHG or antibacterial soaps. If your skin becomes reddened/irritated stop using the CHG.  Do not shave (including legs and underarms) for at least 48 hours prior to first CHG shower. It is OK to shave your face.  Please follow these  instructions carefully.     Shower the NIGHT BEFORE SURGERY and the MORNING OF SURGERY with CHG Soap.   If you chose to wash your hair, wash your hair first as usual with your normal shampoo. After you shampoo, rinse your hair and body thoroughly to remove the shampoo.  Then [employer] and genitals (private parts) with your normal soap and rinse thoroughly to remove soap.  After that Use CHG Soap as you would any other liquid soap. You can apply CHG directly to the skin and wash gently with a scrungie or a clean washcloth.   Apply the CHG Soap to your body ONLY FROM THE NECK DOWN.  Do not use on  open wounds or open sores. Avoid contact with your eyes, ears, mouth and genitals (private parts). Wash Face and genitals (private parts)  with your normal soap.   Wash thoroughly, paying special attention to the area where your surgery will be performed.  Thoroughly rinse your body with warm water from the neck down.  DO NOT shower/wash with your normal soap after using and rinsing off the CHG Soap.  Pat yourself dry with a CLEAN TOWEL.  Wear CLEAN PAJAMAS to bed the night before surgery  Place CLEAN SHEETS on your bed the night before your surgery  DO NOT SLEEP WITH PETS.   Day of Surgery:  Take a shower with CHG soap. Wear Clean/Comfortable clothing the morning of surgery Do not apply any deodorants/lotions.   Remember to brush your teeth WITH YOUR REGULAR TOOTHPASTE.    If you received a COVID test during your pre-op visit, it is requested that you wear a mask when out in public, stay away from anyone that may not be feeling well, and notify your surgeon if you develop symptoms. If you have been in contact with anyone that has tested positive in the last 10 days, please notify your surgeon.    Please read over the following fact sheets that you were given.

## 2022-07-10 ENCOUNTER — Encounter (HOSPITAL_COMMUNITY): Payer: Self-pay

## 2022-07-10 ENCOUNTER — Other Ambulatory Visit: Payer: Self-pay

## 2022-07-10 ENCOUNTER — Encounter (HOSPITAL_COMMUNITY)
Admission: RE | Admit: 2022-07-10 | Discharge: 2022-07-10 | Disposition: A | Discharge status: Home / Self Care | Payer: Commercial Managed Care - HMO | Source: Ambulatory Visit | Attending: Orthopaedic Surgery | Admitting: Orthopaedic Surgery

## 2022-07-10 VITALS — BP 113/89 | HR 90 | Temp 98.2°F | Resp 18 | Ht 69.0 in | Wt 278.9 lb

## 2022-07-10 DIAGNOSIS — Z01812 Encounter for preprocedural laboratory examination: Secondary | ICD-10-CM | POA: Insufficient documentation

## 2022-07-10 DIAGNOSIS — Z01818 Encounter for other preprocedural examination: Secondary | ICD-10-CM

## 2022-07-10 LAB — CBC
HCT: 49 % (ref 39.0–52.0)
Hemoglobin: 16.6 g/dL (ref 13.0–17.0)
MCH: 30.6 pg (ref 26.0–34.0)
MCHC: 33.9 g/dL (ref 30.0–36.0)
MCV: 90.4 fL (ref 80.0–100.0)
Platelets: 273 10*3/uL (ref 150–400)
RBC: 5.42 MIL/uL (ref 4.22–5.81)
RDW: 14.3 % (ref 11.5–15.5)
WBC: 8.3 10*3/uL (ref 4.0–10.5)
nRBC: 0 % (ref 0.0–0.2)

## 2022-07-10 LAB — SURGICAL PCR SCREEN
MRSA, PCR: NEGATIVE
Staphylococcus aureus: POSITIVE — AB

## 2022-07-10 NOTE — Progress Notes (Signed)
PCP - Carlean Jews, NP Cardiologist - denies  PPM/ICD - denies Chest x-ray - N/A EKG - N/A Stress Test - denies ECHO - denies Cardiac Cath - denies  Sleep Study - denies   Blood Thinner Instructions: N/A Aspirin Instructions:N/A  ERAS Protcol - no orders, ERAS per protocol  COVID TEST- N/A  Anesthesia review: no  Patient denies shortness of breath, fever, cough and chest pain at PAT appointment   All instructions explained to the patient, with a verbal understanding of the material. Patient agrees to go over the instructions while at home for a better understanding. Patient also instructed to self quarantine after being tested for COVID-19. The opportunity to ask questions was provided.

## 2022-07-14 NOTE — Progress Notes (Signed)
Office Visit Note   Patient: Shannon Alexander           Date of Birth: 1976-04-23           MRN: [id number] Visit Date: 07/04/2022              Requested by: Carlean Jews, NP [address] [place] [place],  [place] [address] PCP: Carlean Jews, NP   Assessment & Plan: Visit Diagnoses:  1. Spinal stenosis of cervical region   2. Unsteady gait when walking   3. Weakness of left leg   4. 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  Surgical procedure discussed along potential recovery time.  All questions answered.  Follow-Up Instructions: No follow-ups on file.   Orders:  No orders of the defined types were placed in this encounter.  No orders of the defined types were placed in this encounter.     Procedures: No procedures performed   Clinical Data: No additional findings.   Subjective: Chief Complaint  Patient presents with   Neck - Pain    HPI 46 year old male with known history of C3-4 and C4-5 HNP/stenosis and myelopathy comes in for prep evaluation.  States that symptoms unchanged from previous visit.  He is wanting to proceed with C3-4 and C4-5 ACDF is scheduled.  Today history physical performed.  Review of systems negative. Review of Systems  Constitutional:  Positive for activity change.  HENT: Negative.    Respiratory: Negative.    Cardiovascular: Negative.   Gastrointestinal: Negative.   Genitourinary: Negative.   Musculoskeletal:  Positive for gait problem, neck pain and neck stiffness.  Neurological:  Positive for weakness and numbness.     Objective: Vital Signs: BP 121/86   Pulse 84   Temp 98.6 F (37 C)   SpO2 96%   Physical Exam HENT:     Head: Normocephalic and atraumatic.     Nose: Nose normal.  Eyes:     Extraocular Movements: Extraocular movements intact.  Cardiovascular:     Rate and Rhythm: Normal rate and regular rhythm.     Heart sounds: No  murmur heard. Pulmonary:     Effort: Pulmonary effort is normal. No respiratory distress.     Breath sounds: Normal breath sounds.  Abdominal:     General: Bowel sounds are normal. There is no distension.  Neurological:     Mental Status: He is alert.  Psychiatric:        Mood and Affect: Mood normal.     Ortho Exam  Specialty Comments:  No specialty comments available.  Imaging: No results found.   PMFS History: Patient Active Problem List   Diagnosis Date Noted   Spinal stenosis of cervical region 07/03/2022   Other spondylosis with myelopathy, cervical region 07/03/2022   Past Medical History:  Diagnosis Date   Other spondylosis with myelopathy, cervical region 07/03/2022    History reviewed. No pertinent family history.  Past Surgical History:  Procedure Laterality Date   INCISION AND DRAINAGE     when 46 years old d/t ingrown hair on back of head   Social History   Occupational History   Not on file  Tobacco Use   Smoking status: Every Day    Packs/day: 0.50    Years: 15.00    Total pack years: 7.50    Types: Cigarettes   Smokeless tobacco: Not on file  Vaping Use  Vaping Use: Never used  Substance and Sexual Activity   Alcohol use: No   Drug use: No   Sexual activity: Not Currently    Birth control/protection: None

## 2022-07-14 NOTE — H&P (Signed)
Office Visit Note              Patient: Shannon Alexander                                 Date of Birth: 05/24/1976                                                    MRN: [id number] Visit Date: 07/04/2022                                                                     Requested by: Carlean Jews, NP [address] [place],  [place] [address] PCP: Carlean Jews, NP     Assessment & Plan: Visit Diagnoses:  1. Spinal stenosis of cervical region   2. Unsteady gait when walking   3. Weakness of left leg   4. 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  Surgical procedure discussed along potential recovery time.  All questions answered.   Follow-Up Instructions: No follow-ups on file.    Orders:  No orders of the defined types were placed in this encounter.   No orders of the defined types were placed in this encounter.        Procedures: No procedures performed     Clinical Data: No additional findings.     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  States that symptoms unchanged from previous visit.  He is wanting to proceed with C3-4 and C4-5 ACDF is scheduled.  Today history physical performed.  Review of systems negative. Review of Systems  Constitutional:  Positive for activity change.  HENT: Negative.    Respiratory: Negative.    Cardiovascular: Negative.   Gastrointestinal: Negative.   Genitourinary: Negative.   Musculoskeletal:  Positive for gait problem, neck pain and neck stiffness.  Neurological:  Positive for weakness and numbness.        Objective: Vital Signs: BP 121/86   Pulse 84   Temp 98.6 F (37 C)   SpO2 96%    Physical Exam HENT:     Head: Normocephalic and atraumatic.     Nose: Nose normal.  Eyes:      Extraocular Movements: Extraocular movements intact.  Cardiovascular:     Rate and Rhythm: Normal rate and regular rhythm.     Heart sounds: No murmur heard. Pulmonary:     Effort: Pulmonary effort is normal. No respiratory distress.     Breath sounds: Normal breath sounds.  Abdominal:     General: Bowel sounds are normal. There is no distension.  Neurological:     Mental Status: He is alert.  Psychiatric:        Mood and Affect: Mood normal.  Ortho Exam   Specialty Comments:  No specialty comments available.   Imaging: No results found.     PMFS History:     Patient Active Problem List    Diagnosis Date Noted   Spinal stenosis of cervical region 07/03/2022   Other spondylosis with myelopathy, cervical region 07/03/2022        Past Medical History:  Diagnosis Date   Other spondylosis with myelopathy, cervical region 07/03/2022    History reviewed. No pertinent family history.       Past Surgical History:  Procedure Laterality Date   INCISION AND DRAINAGE        when 46 years old d/t ingrown hair on back of head    Social History         Occupational History   Not on file  Tobacco Use   Smoking status: Every Day      Packs/day: 0.50      Years: 15.00      Total pack years: 7.50      Types: Cigarettes   Smokeless tobacco: Not on file  Vaping Use   Vaping Use: Never used  Substance and Sexual Activity   Alcohol use: No   Drug use: No   Sexual activity: Not Currently      Birth control/protection: None                  In

## 2022-07-17 ENCOUNTER — Ambulatory Visit (HOSPITAL_COMMUNITY): Payer: Commercial Managed Care - HMO

## 2022-07-17 ENCOUNTER — Observation Stay (HOSPITAL_COMMUNITY)
Admission: RE | Admit: 2022-07-17 | Discharge: 2022-07-18 | Disposition: A | Discharge status: Home / Self Care | Payer: Commercial Managed Care - HMO | Attending: Orthopaedic Surgery | Admitting: Orthopaedic Surgery

## 2022-07-17 ENCOUNTER — Other Ambulatory Visit: Payer: Self-pay

## 2022-07-17 ENCOUNTER — Ambulatory Visit (HOSPITAL_BASED_OUTPATIENT_CLINIC_OR_DEPARTMENT_OTHER): Payer: Commercial Managed Care - HMO

## 2022-07-17 ENCOUNTER — Encounter (HOSPITAL_COMMUNITY): Payer: Self-pay | Admitting: Orthopaedic Surgery

## 2022-07-17 ENCOUNTER — Encounter (HOSPITAL_COMMUNITY)
Admission: RE | Disposition: A | Discharge status: Home / Self Care | Payer: Self-pay | Source: Home / Self Care | Attending: Orthopaedic Surgery

## 2022-07-17 DIAGNOSIS — G9589 Other specified diseases of spinal cord: Secondary | ICD-10-CM | POA: Diagnosis not present

## 2022-07-17 DIAGNOSIS — M4802 Spinal stenosis, cervical region: Principal | ICD-10-CM | POA: Insufficient documentation

## 2022-07-17 DIAGNOSIS — Z6841 Body Mass Index (BMI) 40.0 and over, adult: Secondary | ICD-10-CM

## 2022-07-17 DIAGNOSIS — M5000 Cervical disc disorder with myelopathy, unspecified cervical region: Secondary | ICD-10-CM | POA: Diagnosis not present

## 2022-07-17 DIAGNOSIS — F17210 Nicotine dependence, cigarettes, uncomplicated: Secondary | ICD-10-CM | POA: Insufficient documentation

## 2022-07-17 DIAGNOSIS — Z981 Arthrodesis status: Secondary | ICD-10-CM

## 2022-07-17 HISTORY — PX: ANTERIOR CERVICAL DECOMP/DISCECTOMY FUSION: SHX1161

## 2022-07-17 SURGERY — ANTERIOR CERVICAL DECOMPRESSION/DISCECTOMY FUSION 2 LEVELS
Anesthesia: General

## 2022-07-17 MED ORDER — METHOCARBAMOL 500 MG PO TABS: 500.0000 mg | ORAL_TABLET | Freq: Four times a day (QID) | ORAL | Status: DC | PRN

## 2022-07-17 MED ORDER — DEXAMETHASONE SODIUM PHOSPHATE 10 MG/ML IJ SOLN
INTRAMUSCULAR | Status: DC | PRN
  Administered 2022-07-17: 10 mg via INTRAVENOUS

## 2022-07-17 MED ORDER — PHENYLEPHRINE 80 MCG/ML (10ML) SYRINGE FOR IV PUSH (FOR BLOOD PRESSURE SUPPORT)
PREFILLED_SYRINGE | INTRAVENOUS | Status: DC | PRN
  Administered 2022-07-17: 160 ug via INTRAVENOUS

## 2022-07-17 MED ORDER — LIDOCAINE 2% (20 MG/ML) 5 ML SYRINGE
INTRAMUSCULAR | Status: AC
  Filled 2022-07-17: qty 5

## 2022-07-17 MED ORDER — KETOROLAC TROMETHAMINE 30 MG/ML IJ SOLN
INTRAMUSCULAR | Status: DC | PRN
  Administered 2022-07-17: 30 mg via INTRAVENOUS

## 2022-07-17 MED ORDER — PHENOL 1.4 % MT LIQD: 1.0000 | OROMUCOSAL | Status: DC | PRN

## 2022-07-17 MED ORDER — DEXAMETHASONE SODIUM PHOSPHATE 10 MG/ML IJ SOLN
INTRAMUSCULAR | Status: AC
  Filled 2022-07-17: qty 1

## 2022-07-17 MED ORDER — ONDANSETRON HCL 4 MG PO TABS
4.0000 mg | ORAL_TABLET | Freq: Four times a day (QID) | ORAL | Status: DC | PRN
Start: 2022-07-17 — End: 2022-07-18

## 2022-07-17 MED ORDER — PHENYLEPHRINE HCL-NACL 20-0.9 MG/250ML-% IV SOLN
INTRAVENOUS | Status: DC | PRN
  Administered 2022-07-17: 20 ug/min via INTRAVENOUS

## 2022-07-17 MED ORDER — HYDROMORPHONE HCL 1 MG/ML IJ SOLN: 0.2500 mg | INTRAMUSCULAR | Status: DC | PRN

## 2022-07-17 MED ORDER — ONDANSETRON HCL 4 MG/2ML IJ SOLN
INTRAMUSCULAR | Status: DC | PRN
  Administered 2022-07-17: 4 mg via INTRAVENOUS

## 2022-07-17 MED ORDER — BUPIVACAINE HCL 0.25 % IJ SOLN
INTRAMUSCULAR | Status: DC | PRN
  Administered 2022-07-17: 6 mL

## 2022-07-17 MED ORDER — PROPOFOL 10 MG/ML IV BOLUS
INTRAVENOUS | Status: AC
  Filled 2022-07-17: qty 20

## 2022-07-17 MED ORDER — CEFAZOLIN SODIUM-DEXTROSE 2-4 GM/100ML-% IV SOLN
2.0000 g | INTRAVENOUS | Status: AC
  Administered 2022-07-17: 2 g via INTRAVENOUS
  Filled 2022-07-17: qty 100

## 2022-07-17 MED ORDER — SODIUM CHLORIDE 0.9% FLUSH
3.0000 mL | INTRAVENOUS | Status: DC | PRN
Start: 2022-07-17 — End: 2022-07-18

## 2022-07-17 MED ORDER — POLYETHYLENE GLYCOL 3350 17 G PO PACK
17.0000 g | PACK | Freq: Every day | ORAL | Status: DC
  Administered 2022-07-17 – 2022-07-18 (×2): 17 g via ORAL
  Filled 2022-07-17 (×2): qty 1

## 2022-07-17 MED ORDER — ACETAMINOPHEN 500 MG PO TABS
1000.0000 mg | ORAL_TABLET | Freq: Once | ORAL | Status: AC
  Administered 2022-07-17: 1000 mg via ORAL
  Filled 2022-07-17: qty 2

## 2022-07-17 MED ORDER — MIDAZOLAM HCL 2 MG/2ML IJ SOLN
INTRAMUSCULAR | Status: DC | PRN
  Administered 2022-07-17: 2 mg via INTRAVENOUS

## 2022-07-17 MED ORDER — OXYCODONE HCL 5 MG PO TABS: 5.0000 mg | ORAL_TABLET | Freq: Once | ORAL | Status: DC | PRN

## 2022-07-17 MED ORDER — CHLORHEXIDINE GLUCONATE 0.12 % MT SOLN
15.0000 mL | Freq: Once | OROMUCOSAL | Status: AC
  Administered 2022-07-17: 15 mL via OROMUCOSAL
  Filled 2022-07-17: qty 15

## 2022-07-17 MED ORDER — KETOROLAC TROMETHAMINE 30 MG/ML IJ SOLN
INTRAMUSCULAR | Status: AC
Start: 2022-07-17 — End: ?
  Filled 2022-07-17: qty 1

## 2022-07-17 MED ORDER — HEMOSTATIC AGENTS (NO CHARGE) OPTIME
TOPICAL | Status: DC | PRN
  Administered 2022-07-17: 1

## 2022-07-17 MED ORDER — MEPERIDINE HCL 25 MG/ML IJ SOLN: 6.2500 mg | INTRAMUSCULAR | Status: DC | PRN

## 2022-07-17 MED ORDER — SUGAMMADEX SODIUM 200 MG/2ML IV SOLN
INTRAVENOUS | Status: DC | PRN
  Administered 2022-07-17: 400 mg via INTRAVENOUS

## 2022-07-17 MED ORDER — MIDAZOLAM HCL 2 MG/2ML IJ SOLN
INTRAMUSCULAR | Status: AC
Start: 2022-07-17 — End: ?
  Filled 2022-07-17: qty 2

## 2022-07-17 MED ORDER — FENTANYL CITRATE (PF) 250 MCG/5ML IJ SOLN
INTRAMUSCULAR | Status: DC | PRN
  Administered 2022-07-17: 50 ug via INTRAVENOUS
  Administered 2022-07-17: 150 ug via INTRAVENOUS
  Administered 2022-07-17: 50 ug via INTRAVENOUS

## 2022-07-17 MED ORDER — OXYCODONE HCL 5 MG/5ML PO SOLN: 5.0000 mg | Freq: Once | ORAL | Status: DC | PRN

## 2022-07-17 MED ORDER — HYDROMORPHONE HCL 1 MG/ML IJ SOLN: 0.5000 mg | INTRAMUSCULAR | Status: DC | PRN

## 2022-07-17 MED ORDER — SODIUM CHLORIDE 0.9 % IV SOLN: 250.0000 mL | INTRAVENOUS | Status: DC

## 2022-07-17 MED ORDER — ACETAMINOPHEN 650 MG RE SUPP: 650.0000 mg | RECTAL | Status: DC | PRN

## 2022-07-17 MED ORDER — FENTANYL CITRATE (PF) 250 MCG/5ML IJ SOLN
INTRAMUSCULAR | Status: AC
  Filled 2022-07-17: qty 5

## 2022-07-17 MED ORDER — 0.9 % SODIUM CHLORIDE (POUR BTL) OPTIME
TOPICAL | Status: DC | PRN
  Administered 2022-07-17: 1000 mL

## 2022-07-17 MED ORDER — MENTHOL 3 MG MT LOZG
1.0000 | LOZENGE | OROMUCOSAL | Status: DC | PRN
  Administered 2022-07-18: 3 mg via ORAL
  Filled 2022-07-17: qty 9

## 2022-07-17 MED ORDER — LIDOCAINE 2% (20 MG/ML) 5 ML SYRINGE
INTRAMUSCULAR | Status: DC | PRN
  Administered 2022-07-17: 60 mg via INTRAVENOUS

## 2022-07-17 MED ORDER — LACTATED RINGERS IV SOLN: INTRAVENOUS | Status: DC

## 2022-07-17 MED ORDER — MIDAZOLAM HCL 2 MG/2ML IJ SOLN: 0.5000 mg | Freq: Once | INTRAMUSCULAR | Status: DC | PRN

## 2022-07-17 MED ORDER — OXYCODONE HCL 5 MG PO TABS
5.0000 mg | ORAL_TABLET | ORAL | Status: DC | PRN
  Administered 2022-07-18 (×2): 10 mg via ORAL
  Filled 2022-07-17 (×2): qty 2

## 2022-07-17 MED ORDER — ROCURONIUM BROMIDE 10 MG/ML (PF) SYRINGE
PREFILLED_SYRINGE | INTRAVENOUS | Status: AC
  Filled 2022-07-17: qty 10

## 2022-07-17 MED ORDER — SODIUM CHLORIDE 0.9% FLUSH
3.0000 mL | Freq: Two times a day (BID) | INTRAVENOUS | Status: DC
  Administered 2022-07-17 – 2022-07-18 (×2): 3 mL via INTRAVENOUS

## 2022-07-17 MED ORDER — PROPOFOL 10 MG/ML IV BOLUS
INTRAVENOUS | Status: DC | PRN
  Administered 2022-07-17: 200 mg via INTRAVENOUS

## 2022-07-17 MED ORDER — DOCUSATE SODIUM 100 MG PO CAPS
100.0000 mg | ORAL_CAPSULE | Freq: Two times a day (BID) | ORAL | Status: DC
  Administered 2022-07-17 – 2022-07-18 (×2): 100 mg via ORAL
  Filled 2022-07-17 (×2): qty 1

## 2022-07-17 MED ORDER — ROCURONIUM BROMIDE 10 MG/ML (PF) SYRINGE
PREFILLED_SYRINGE | INTRAVENOUS | Status: DC | PRN
  Administered 2022-07-17 (×2): 30 mg via INTRAVENOUS
  Administered 2022-07-17: 70 mg via INTRAVENOUS
  Administered 2022-07-17: 30 mg via INTRAVENOUS

## 2022-07-17 MED ORDER — METHOCARBAMOL 1000 MG/10ML IJ SOLN: 500.0000 mg | Freq: Four times a day (QID) | INTRAVENOUS | Status: DC | PRN

## 2022-07-17 MED ORDER — BUPIVACAINE HCL (PF) 0.25 % IJ SOLN
INTRAMUSCULAR | Status: AC
  Filled 2022-07-17: qty 30

## 2022-07-17 MED ORDER — ONDANSETRON HCL 4 MG/2ML IJ SOLN
INTRAMUSCULAR | Status: AC
  Filled 2022-07-17: qty 2

## 2022-07-17 MED ORDER — ORAL CARE MOUTH RINSE: 15.0000 mL | Freq: Once | OROMUCOSAL | Status: AC

## 2022-07-17 MED ORDER — SODIUM CHLORIDE 0.9 % IV SOLN: INTRAVENOUS | Status: DC

## 2022-07-17 MED ORDER — ONDANSETRON HCL 4 MG/2ML IJ SOLN
4.0000 mg | Freq: Four times a day (QID) | INTRAMUSCULAR | Status: DC | PRN
Start: 2022-07-17 — End: 2022-07-18

## 2022-07-17 MED ORDER — ACETAMINOPHEN 325 MG PO TABS: 650.0000 mg | ORAL_TABLET | ORAL | Status: DC | PRN

## 2022-07-17 MED ORDER — PROMETHAZINE HCL 25 MG/ML IJ SOLN: 6.2500 mg | INTRAMUSCULAR | Status: DC | PRN

## 2022-07-17 SURGICAL SUPPLY — 54 items
BAG COUNTER SPONGE SURGICOUNT (BAG) ×1 IMPLANT
BENZOIN TINCTURE PRP APPL 2/3 (GAUZE/BANDAGES/DRESSINGS) ×1 IMPLANT
BIT DRILL SM SPINE QC 14 (BIT) IMPLANT
BLADE CLIPPER SURG (BLADE) IMPLANT
BONE CC-ACS 11X14X7 6D (Bone Implant) ×2 IMPLANT
BUR ROUND FLUTED 4 SOFT TCH (BURR) ×1 IMPLANT
CHIPS BONE CANC-ACS11X14X7 6D (Bone Implant) IMPLANT
CLSR STERI-STRIP ANTIMIC 1/2X4 (GAUZE/BANDAGES/DRESSINGS) IMPLANT
COLLAR CERV LO CONTOUR FIRM DE (SOFTGOODS) IMPLANT
CONT PATH 170OZ SNAP LID (GAUZE/BANDAGES/DRESSINGS) IMPLANT
CORD BIPOLAR FORCEPS 12FT (ELECTRODE) ×1 IMPLANT
COVER SURGICAL LIGHT HANDLE (MISCELLANEOUS) ×1 IMPLANT
DRAPE C-ARM 42X72 X-RAY (DRAPES) ×1 IMPLANT
DRAPE HALF SHEET 40X57 (DRAPES) ×1 IMPLANT
DRAPE MICROSCOPE LEICA (MISCELLANEOUS) ×1 IMPLANT
DURAPREP 6ML APPLICATOR 50/CS (WOUND CARE) ×1 IMPLANT
ELECT COATED BLADE 2.86 ST (ELECTRODE) ×1 IMPLANT
ELECT REM PT RETURN 9FT ADLT (ELECTROSURGICAL) ×1
ELECTRODE REM PT RTRN 9FT ADLT (ELECTROSURGICAL) ×1 IMPLANT
EVACUATOR 1/8 PVC DRAIN (DRAIN) IMPLANT
GAUZE SPONGE 4X4 12PLY STRL (GAUZE/BANDAGES/DRESSINGS) ×1 IMPLANT
GLOVE BIOGEL PI IND STRL 8 (GLOVE) ×2 IMPLANT
GLOVE BIOGEL PI INDICATOR 8 (GLOVE) ×2
GLOVE ORTHO TXT STRL SZ7.5 (GLOVE) ×2 IMPLANT
GOWN STRL REUS W/ TWL LRG LVL3 (GOWN DISPOSABLE) ×1 IMPLANT
GOWN STRL REUS W/ TWL XL LVL3 (GOWN DISPOSABLE) ×1 IMPLANT
GOWN STRL REUS W/TWL 2XL LVL3 (GOWN DISPOSABLE) ×1 IMPLANT
GOWN STRL REUS W/TWL LRG LVL3 (GOWN DISPOSABLE) ×1
GOWN STRL REUS W/TWL XL LVL3 (GOWN DISPOSABLE) ×1
HALTER HD/CHIN CERV TRACTION D (MISCELLANEOUS) ×1 IMPLANT
HEMOSTAT SURGICEL 2X14 (HEMOSTASIS) IMPLANT
KIT BASIN OR (CUSTOM PROCEDURE TRAY) ×1 IMPLANT
KIT TURNOVER KIT B (KITS) ×1 IMPLANT
MANIFOLD NEPTUNE II (INSTRUMENTS) IMPLANT
NDL 25GX 5/8IN NON SAFETY (NEEDLE) ×1 IMPLANT
NEEDLE 25GX 5/8IN NON SAFETY (NEEDLE) ×2 IMPLANT
NS IRRIG 1000ML POUR BTL (IV SOLUTION) ×1 IMPLANT
PACK ORTHO CERVICAL (CUSTOM PROCEDURE TRAY) ×1 IMPLANT
PAD ARMBOARD 7.5X6 YLW CONV (MISCELLANEOUS) ×2 IMPLANT
PATTIES SURGICAL .5 X.5 (GAUZE/BANDAGES/DRESSINGS) IMPLANT
PIN TEMP FIXATION KIRSCHNER (EXFIX) IMPLANT
PLATE ANT CERV XTEND 2 LV 32 (Plate) IMPLANT
POSITIONER HEAD DONUT 9IN (MISCELLANEOUS) ×1 IMPLANT
RESTRAINT LIMB HOLDER UNIV (RESTRAINTS) IMPLANT
SCREW XTD VAR 4.2 SELF TAP (Screw) IMPLANT
SPONGE INTESTINAL PEANUT (DISPOSABLE) IMPLANT
SURGIFLO W/THROMBIN 8M KIT (HEMOSTASIS) IMPLANT
SUT BONE WAX W31G (SUTURE) ×1 IMPLANT
SUT VIC AB 3-0 X1 27 (SUTURE) ×1 IMPLANT
SUT VICRYL 4-0 PS2 18IN ABS (SUTURE) ×2 IMPLANT
TOWEL GREEN STERILE (TOWEL DISPOSABLE) ×1 IMPLANT
TOWEL GREEN STERILE FF (TOWEL DISPOSABLE) ×1 IMPLANT
TRAY FOLEY W/BAG SLVR 16FR (SET/KITS/TRAYS/PACK)
TRAY FOLEY W/BAG SLVR 16FR ST (SET/KITS/TRAYS/PACK) IMPLANT

## 2022-07-17 NOTE — Anesthesia Procedure Notes (Signed)
Procedure Name: Intubation Date/Time: 07/17/2022 10:20 AM  Performed by: Nadara Mustard, CRNAPre-anesthesia Checklist: Patient identified, Emergency Drugs available, Suction available and Patient being monitored Patient Re-evaluated:Patient Re-evaluated prior to induction Oxygen Delivery Method: Circle system utilized Preoxygenation: Pre-oxygenation with 100% oxygen Induction Type: IV induction Ventilation: Mask ventilation without difficulty Laryngoscope Size: Glidescope and 4 Grade View: Grade I Tube type: Oral Tube size: 7.5 mm Number of attempts: 1 Airway Equipment and Method: Stylet and Oral airway Placement Confirmation: ETT inserted through vocal cords under direct vision, positive ETCO2 and breath sounds checked- equal and bilateral Secured at: 24 cm Tube secured with: Tape Dental Injury: Teeth and Oropharynx as per pre-operative assessment  Comments: Head/neck remained in neutral position during entire intubation procedure

## 2022-07-17 NOTE — Anesthesia Postprocedure Evaluation (Signed)
Anesthesia Post Note  Patient: Badr Piedra  Procedure(s) Performed: C3-4, C4-5 ANTERIOR CERVICAL DISCECTOMY FUSION, ALLOGRAFT AND PLATE     Patient location during evaluation: PACU Anesthesia Type: General Level of consciousness: awake and alert, patient cooperative and oriented Pain management: pain level controlled Vital Signs Assessment: post-procedure vital signs reviewed and stable Respiratory status: spontaneous breathing, nonlabored ventilation, respiratory function stable and patient connected to nasal cannula oxygen Cardiovascular status: blood pressure returned to baseline and stable Postop Assessment: no apparent nausea or vomiting Anesthetic complications: no   No notable events documented.  Last Vitals:  Vitals:   07/17/22 1415 07/17/22 1430  BP: 103/70 100/64  Pulse: 74 74  Resp: 18 19  Temp:    SpO2: 95% 96%    Last Pain:  Vitals:   07/17/22 1430  TempSrc:   PainSc: 0-No pain    LLE Motor Response: Purposeful movement (07/17/22 1430) LLE Sensation: Full sensation (07/17/22 1430) RLE Motor Response: Purposeful movement (07/17/22 1430) RLE Sensation: Full sensation (07/17/22 1430)      Vertie Dibbern,E. Damare Serano

## 2022-07-17 NOTE — Anesthesia Preprocedure Evaluation (Addendum)
Anesthesia Evaluation  Patient identified by MRN, date of birth, ID band Patient awake    Reviewed: Allergy & Precautions, NPO status , Patient's Chart, lab work & pertinent test results  History of Anesthesia Complications Negative for: history of anesthetic complications  Airway Mallampati: II  TM Distance: >3 FB Neck ROM: Full    Dental  (+) Dental Advisory Given, Teeth Intact   Pulmonary Current Smoker and Patient abstained from smoking.,    breath sounds clear to auscultation       Cardiovascular negative cardio ROS   Rhythm:Regular Rate:Normal     Neuro/Psych negative psych ROS   GI/Hepatic negative GI ROS, Neg liver ROS,   Endo/Other  Morbid obesityBMI 41  Renal/GU negative Renal ROS     Musculoskeletal   Abdominal (+) + obese,   Peds  Hematology negative hematology ROS (+)   Anesthesia Other Findings   Reproductive/Obstetrics                            Anesthesia Physical Anesthesia Plan  ASA: 3  Anesthesia Plan: General   Post-op Pain Management: Tylenol PO (pre-op)*   Induction: Intravenous  PONV Risk Score and Plan: 1 and Ondansetron and Dexamethasone  Airway Management Planned: Oral ETT and Video Laryngoscope Planned  Additional Equipment: None  Intra-op Plan:   Post-operative Plan: Extubation in OR  Informed Consent: I have reviewed the patients History and Physical, chart, labs and discussed the procedure including the risks, benefits and alternatives for the proposed anesthesia with the patient or authorized representative who has indicated his/her understanding and acceptance.     Dental advisory given  Plan Discussed with: CRNA and Surgeon  Anesthesia Plan Comments:        Anesthesia Quick Evaluation

## 2022-07-17 NOTE — Progress Notes (Signed)
Orthopedic Tech Progress Note Patient Details:  Shannon Alexander 1976/03/09 [id number]  RN stated "patient has soft collar"  Patient ID: Shannon Alexander, male   DOB: Jun 01, 1976, 46 y.o.   MRN: [id number]  Shannon Alexander 07/17/2022, 5:10 PM

## 2022-07-17 NOTE — Interval H&P Note (Signed)
History and Physical Interval Note:  07/17/2022 9:56 AM  Blake Divine  has presented today for surgery, with the diagnosis of C3-4, C4-5 cervical myelopathy.  The various methods of treatment have been discussed with the patient and family. After consideration of risks, benefits and other options for treatment, the patient has consented to  Procedure(s): C3-4, C4-5 ANTERIOR CERVICAL DISCECTOMY FUSION, ALLOGRAFT AND PLATE (N/A) as a surgical intervention.  The patient's history has been reviewed, patient examined, no change in status, stable for surgery.  I have reviewed the patient's chart and labs.  Questions were answered to the patient's satisfaction.     Eldred Manges

## 2022-07-17 NOTE — Transfer of Care (Signed)
Immediate Anesthesia Transfer of Care Note  Patient: Shannon Alexander  Procedure(s) Performed: C3-4, C4-5 ANTERIOR CERVICAL DISCECTOMY FUSION, ALLOGRAFT AND PLATE  Patient Location: PACU  Anesthesia Type:General  Level of Consciousness: awake and patient cooperative  Airway & Oxygen Therapy: Patient Spontanous Breathing  Post-op Assessment: Report given to RN, Post -op Vital signs reviewed and stable and Patient moving all extremities X 4  Post vital signs: Reviewed and stable  Last Vitals:  Vitals Value Taken Time  BP 109/57 07/17/22 1341  Temp    Pulse 87 07/17/22 1343  Resp 19 07/17/22 1343  SpO2 94 % 07/17/22 1343  Vitals shown include unvalidated device data.  Last Pain:  Vitals:   07/17/22 0830  TempSrc:   PainSc: 0-No pain         Complications: No notable events documented.

## 2022-07-17 NOTE — Op Note (Addendum)
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  Procedure: C3-4, C4-5 anterior cervical discectomy and fusion, allograft and plate.  Surgeon: Annell Greening, MD  Assistant: Zonia Kief, PA-C medically necessary and present for the entire procedure  Anesthesia: General orotracheal with glide scope +60 cc Marcaine skin local.  Drains 1 Hemovac neck.  Findings: Cervical stenosis broad-based disc protrusion for spondylosis C3-4 C4-5 consistent with preop MRI scan and previous MRI scan 4-year-old. Implants  BONE CC-ACS [id number] - [id number]  Inventory Item: BONE CC-ACS [id number] 6D Serial no.: [id number] Model/Cat no.: [id number]  Implant name: BONE CC-ACS [id number] 6D - [id number] Laterality: N/A Area: Spine Cervical  Manufacturer: Tobie Poet FNDN Date of Manufacture:    Action: Implanted Number Used: 1   Device Identifier:  Device Identifier Type:     BONE CC-ACS [id number] - [id number]  Inventory Item: BONE CC-ACS [id number] 6D Serial no.: [id number] Model/Cat no.: [id number]  Implant name: BONE CC-ACS [id number] 6D - [id number] Laterality: N/A Area: Spine Cervical  Manufacturer: Tobie Poet FNDN Date of Manufacture:    Action: Implanted Number Used: 1   Device Identifier:  Device Identifier Type:     PLATE ANT CERV XTEND 2 LV 32 - [id number]  Inventory Item: PLATE ANT CERV XTEND 2 LV 32 Serial no.:  Model/Cat no.: [id number]  Implant name: PLATE ANT CERV XTEND 2 LV 32 - [id number] Laterality: N/A Area: Spine Cervical  Manufacturer: [employer] Date of Manufacture:    Action: Implanted Number Used: 1   Device Identifier:  Device Identifier Type:     SCREW XTD VAR 4.2 SELF TAP - [id number]  Inventory Item: SCREW XTD VAR 4.2 SELF TAP Serial no.:  Model/Cat no.: [id number]  Implant name: SCREW XTD VAR 4.2 SELF TAP - [id number] Laterality: N/A Area: Spine Cervical  Manufacturer: [employer] Date of  Manufacture:    Action: Implanted Number Used: 1   Device Identifier:  Device Identifier Type:     Procedure: After induction of general anesthesia orotracheal ovation preoperative antibiotic standard prepping and draping with had ultra traction no weights sterile Mayo stand at the head neck was prepped with DuraPrep scrub towels Betadine Steri-Drape applied after sterile skin marker was used and a prominent upper neck fold on the left side.  Patient has short neck and thyroid sheets and drapes were applied timeout procedure completed.  Incision started to midline extending to the left blunt dissection down the prominent spurs at C4-5 and C5-6 with exposure at C3-4 and C4-5.  Short 25 needle was placed in the C3-4 disc space confirmed with appropriate level with crosstable lateral sterilely draped C arm.  Discectomy was performed taking chunks of disc out to market and then we addressed the C4-5 level first which had the most severe pathology with disc protrusion and severe stenosis.  Both levels had cord myelomalacia changes.  Operative microscope was draped brought and we progressed back to posterior longitudinal ligament removing anterior spurs enlarging the disc space taking some of the endplate for exposure and then removing large chunks of disc hypertrophic ligamentum and overhanging endplate spurs posteriorly.  Uncovertebral joints were stripped and care was taken to make sure that on each side of the graft position that there was room for any egress of fluids.  Once the dura was decompressed and bulging out into the space between the endplates at C4 and C5 careful palpation showed no remaining fragments present trial sizers were tried with a 6 and a seven  7 mm graft was selected countersunk as tight.  Identical procedure repeated at C3-4.  Again the 7 mm graft was appropriate fit and as CRNA pulled on traction the graft was countersunk 1 mm.  Both grafts are snug and secure.  Plate was selected as  listed below globus and then all sick screws were placed.  1 superior screw on the right side angled slightly lateral still inside bone but it was repositioned with some convergence and then rechecked under AP and lateral fluoroscopy in good position final spot pictures were taken copious irrigation the tinea screwdriver was used to lock all sick screws.  Hemovac placed in and out technique in line with the skin incision copious irrigation operative field was dry.  Platysma reapproximated with 3-0 Vicryl 4-0 Vicryl subcuticular closure tincture benzoin Steri-Strips 4 x 4's tape and soft collar with collar extender was applied.  Patient was transferred to care room.  In the care room there is able to do a straight leg raise both right and left still has slight quad weakness on the left still had clonus lower extremities and was moving upper extremities biceps triceps wrist flexors and could not tell if his numbness is improved since he was not quite awake enough.  Skin had been closed with Steri-Strips Marcaine was infiltrated 6 cc prior to 4 x 4 and

## 2022-07-18 ENCOUNTER — Encounter: Payer: Self-pay | Admitting: Nurse Practitioner

## 2022-07-18 DIAGNOSIS — M4802 Spinal stenosis, cervical region: Secondary | ICD-10-CM | POA: Diagnosis not present

## 2022-07-18 MED ORDER — OXYCODONE-ACETAMINOPHEN 5-325 MG PO TABS: 1.0000 | ORAL_TABLET | ORAL | 0 refills | Status: AC | PRN

## 2022-07-18 MED ORDER — METHOCARBAMOL 500 MG PO TABS: 500.0000 mg | ORAL_TABLET | Freq: Four times a day (QID) | ORAL | 0 refills | Status: AC | PRN

## 2022-07-18 NOTE — Evaluation (Signed)
Occupational Therapy Evaluation Patient Details Name: Shannon Alexander MRN: [id number] DOB: 1976/05/23 Today's Date: 07/18/2022   History of Present Illness 46 yo male s/p ACDF C3-4 C4-5 PMH spinal stenosis of cervical region; smoker   Clinical Impression   Patient is s/p ACDF C3-4 C4-5 surgery resulting in functional limitations due to the deficits listed below (see OT problem list). Pt progressed to bathroom level care this session. Pt noted to have L LE clonus and reports this is "worse since surgery". Pt with L UE sensation changes and reports tingling.  Patient will benefit from skilled OT acutely to increase independence and safety with ADLS to allow discharge outpatient OT.       Recommendations for follow up therapy are one component of a multi-disciplinary discharge planning process, led by the attending physician.  Recommendations may be updated based on patient status, additional functional criteria and insurance authorization.   Follow Up Recommendations  Outpatient OT    Assistance Recommended at Discharge Set up Supervision/Assistance  Patient can return home with the following A little help with bathing/dressing/bathroom;Assist for transportation    Functional Status Assessment  Patient has had a recent decline in their functional status and demonstrates the ability to make significant improvements in function in a reasonable and predictable amount of time.  Equipment Recommendations  BSC/3in1;Other (comment) (RW)    Recommendations for Other Services       Precautions / Restrictions Precautions Precautions: Cervical Precaution Comments: hand out present in room., JP drain Restrictions Weight Bearing Restrictions: No      Mobility Bed Mobility               General bed mobility comments: oob    Transfers Overall transfer level: Needs assistance Equipment used: Rolling walker (2 wheels) Transfers: Sit to/from Stand Sit to Stand: Min assist            General transfer comment: pt with cues to power up with maximized use of legs not arms      Balance Overall balance assessment: Mild deficits observed, not formally tested                                         ADL either performed or assessed with clinical judgement   ADL Overall ADL's : Needs assistance/impaired Eating/Feeding: Set up;Sitting   Grooming: Oral care;Wash/dry face;Wash/dry hands;Minimal assistance;Standing Grooming Details (indicate cue type and reason): needs (A) to open containers. able to apply tooth paste with increased time             Lower Body Dressing: Minimal assistance;Sit to/from stand   Toilet Transfer: Minimal assistance;Rolling walker (2 wheels)           Functional mobility during ADLs: Min guard;Rolling walker (2 wheels) General ADL Comments: pt finishing PT session and starting OT session from chair. pt progressed to sink level grooming task.     Vision Baseline Vision/History: 0 No visual deficits       Perception     Praxis      Pertinent Vitals/Pain Pain Assessment Pain Assessment: Faces Faces Pain Scale: Hurts little more Pain Location: throat Pain Descriptors / Indicators: Sore Pain Intervention(s): Monitored during session, Repositioned     Hand Dominance Right   Extremity/Trunk Assessment Upper Extremity Assessment Upper Extremity Assessment: RUE deficits/detail;LUE deficits/detail RUE Deficits / Details: able to sustain grasp with out deficits LUE Deficits / Details: noted to  have decreased fine motor and slight tremor. pt reports tremor is not new. LUE Sensation: decreased light touch LUE Coordination: decreased gross motor;decreased fine motor   Lower Extremity Assessment Lower Extremity Assessment: Defer to PT evaluation;LLE deficits/detail LLE Deficits / Details: noted to have clonus   Cervical / Trunk Assessment Cervical / Trunk Assessment: Neck Surgery   Communication  Communication Communication: No difficulties   Cognition Arousal/Alertness: Awake/alert Behavior During Therapy: WFL for tasks assessed/performed Overall Cognitive Status: Within Functional Limits for tasks assessed                                 General Comments: pt reports no sleep so slower processing due to fatigue. pt could benefit from sleep to process education     General Comments  pt currently with JP drain,    Exercises Exercises: Other exercises Other Exercises Other Exercises: started fine motor exercises and discussion on L hand use   Shoulder Instructions      Home Living Family/patient expects to be discharged to:: Private residence Living Arrangements: Parent Available Help at Discharge: Family Type of Home: House Home Access: Stairs to enter Secretary/administrator of Steps: 3 Entrance Stairs-Rails: Right;Left Home Layout: One level     Bathroom Shower/Tub: Chief Strategy Officer: Standard     Home Equipment: None          Prior Functioning/Environment Prior Level of Function : Independent/Modified Independent;Driving               ADLs Comments: normally using slip on tennis shoes due to progresive back pain.        OT Problem List: Decreased activity tolerance;Impaired balance (sitting and/or standing);Decreased knowledge of precautions;Decreased knowledge of use of DME or AE;Decreased coordination      OT Treatment/Interventions: Self-care/ADL training;Therapeutic exercise;Neuromuscular education;Energy conservation;DME and/or AE instruction;Manual therapy;Modalities;Therapeutic activities;Patient/family education;Balance training    OT Goals(Current goals can be found in the care plan section) Acute Rehab OT Goals Patient Stated Goal: to be able to use both hands better OT Goal Formulation: With patient Time For Goal Achievement: 08/01/22 Potential to Achieve Goals: Good  OT Frequency: Min 3X/week     Co-evaluation              AM-PAC OT "6 Clicks" Daily Activity     Outcome Measure Help from another person eating meals?: A Little Help from another person taking care of personal grooming?: A Little Help from another person toileting, which includes using toliet, bedpan, or urinal?: A Little Help from another person bathing (including washing, rinsing, drying)?: A Little Help from another person to put on and taking off regular upper body clothing?: A Little Help from another person to put on and taking off regular lower body clothing?: A Little 6 Click Score: 18   End of Session Equipment Utilized During Treatment: Rolling walker (2 wheels);Gait belt Nurse Communication: Mobility status;Precautions  Activity Tolerance: Patient tolerated treatment well Patient left: in chair;with call bell/phone within reach;with chair alarm set  OT Visit Diagnosis: Unsteadiness on feet (R26.81);Muscle weakness (generalized) (M62.81)                Time: 1610-9604 OT Time Calculation (min): 28 min Charges:  OT General Charges $OT Visit: 1 Visit OT Evaluation $OT Eval Moderate Complexity: 1 Mod OT Treatments $Self Care/Home Management : 8-22 mins   Brynn, OTR/L  Acute Rehabilitation Services Office: [phone] .   Timmothy Euler  Yetta Barre 07/18/2022, 11:31 AM

## 2022-07-18 NOTE — Progress Notes (Signed)
Patient ID: Shannon Alexander, male   DOB: 07/14/1976, 46 y.o.   MRN: [id number]   Doing well.  Will have PT work with patient again this afternoon and anticipate d/c home today.  RN will call me to let me know how he does and get possible d/c order.

## 2022-07-18 NOTE — Progress Notes (Signed)
Orthopedic Tech Progress Note Patient Details:  Hendricks Schwandt Feb 27, 1976 [id number]  EXTRA soft collar   Ortho Devices Type of Ortho Device: Soft collar Ortho Device/Splint Location: NECK Ortho Device/Splint Interventions: Ordered   Post Interventions Patient Tolerated: Other (comment) Instructions Provided: Other (comment)  Donald Pore 07/18/2022, 5:17 PM

## 2022-07-18 NOTE — Evaluation (Signed)
Physical Therapy Evaluation Patient Details Name: Shannon Alexander MRN: [id number] DOB: [birth year] Today's Date: 07/18/2022  History of Present Illness  46 yo male s/p ACDF C3-4 C4-5 PMH spinal stenosis of cervical region; smoker  Clinical Impression  Pt admitted s/p procedure listed above. Pt reports centralization of pain and denies radicular symptoms; continues with + LLE clonus and decreased left hand sensation. Pt demonstrates 5/5 BLE strength. Pt overall is mobilizing fairly well. Ambulating 150 ft with a walker at a min guard assist level. Demonstrates increased LLE circumduction during gait. He would benefit from OPPT to address further balance, gait training, and strengthening in order to maximize functional independence. Suspect good progress given age and motivation.     Recommendations for follow up therapy are one component of a multi-disciplinary discharge planning process, led by the attending physician.  Recommendations may be updated based on patient status, additional functional criteria and insurance authorization.  Follow Up Recommendations Outpatient PT (when cleared by surgeon)      Assistance Recommended at Discharge PRN  Patient can return home with the following  Assistance with cooking/housework;Assist for transportation;Help with stairs or ramp for entrance    Equipment Recommendations Rolling walker (2 wheels)  Recommendations for Other Services       Functional Status Assessment Patient has had a recent decline in their functional status and demonstrates the ability to make significant improvements in function in a reasonable and predictable amount of time.     Precautions / Restrictions Precautions Precautions: Cervical Precaution Booklet Issued: Yes (comment) Precaution Comments: JP drain Required Braces or Orthoses: Cervical Brace Cervical Brace: Soft collar Restrictions Weight Bearing Restrictions: No      Mobility  Bed Mobility Overal bed  mobility: Needs Assistance Bed Mobility: Rolling, Sidelying to Sit Rolling: Modified independent (Device/Increase time) Sidelying to sit: Supervision       General bed mobility comments: HOB elevated, cues for log roll technique, no physical assist required    Transfers Overall transfer level: Needs assistance Equipment used: Rolling walker (2 wheels) Transfers: Sit to/from Stand Sit to Stand: Min guard           General transfer comment: Min guard for safety    Ambulation/Gait Ambulation/Gait assistance: Min guard Gait Distance (Feet): 150 Feet Assistive device: Rolling walker (2 wheels) Gait Pattern/deviations: Step-through pattern, Decreased stride length Gait velocity: decreased     General Gait Details: Pt with increased LLE circumduction, overall steady gait with min guard assist  Stairs            Wheelchair Mobility    Modified Rankin (Stroke Patients Only)       Balance Overall balance assessment: Mild deficits observed, not formally tested                                           Pertinent Vitals/Pain Pain Assessment Pain Assessment: Faces Faces Pain Scale: Hurts little more Pain Location: throat Pain Descriptors / Indicators: Sore Pain Intervention(s): Monitored during session    Home Living Family/patient expects to be discharged to:: Private residence Living Arrangements: Parent Available Help at Discharge: Family Type of Home: House Home Access: Stairs to enter Entrance Stairs-Rails: Doctor, general practice of Steps: 3   Home Layout: One level Home Equipment: None      Prior Function Prior Level of Function : Independent/Modified Independent;Driving  ADLs Comments: normally using slip on tennis shoes due to progresive back pain.     Hand Dominance   Dominant Hand: Right    Extremity/Trunk Assessment   Upper Extremity Assessment Upper Extremity Assessment: Defer to OT  evaluation RUE Deficits / Details: able to sustain grasp with out deficits LUE Deficits / Details: noted to have decreased fine motor and slight tremor. pt reports tremor is not new. LUE Sensation: decreased light touch LUE Coordination: decreased gross motor;decreased fine motor    Lower Extremity Assessment Lower Extremity Assessment: RLE deficits/detail;LLE deficits/detail RLE Deficits / Details: Strength 5/5 LLE Deficits / Details: Strength 5/5; +clonus    Cervical / Trunk Assessment Cervical / Trunk Assessment: Neck Surgery  Communication   Communication: No difficulties  Cognition Arousal/Alertness: Awake/alert Behavior During Therapy: WFL for tasks assessed/performed Overall Cognitive Status: Within Functional Limits for tasks assessed                                 General Comments: pt reports no sleep so slower processing due to fatigue. pt could benefit from sleep to process education        General Comments General comments (skin integrity, edema, etc.): pt currently with JP drain,    Exercises     Assessment/Plan    PT Assessment Patient needs continued PT services  PT Problem List Decreased strength;Decreased activity tolerance;Decreased balance;Decreased mobility;Pain       PT Treatment Interventions DME instruction;Gait training;Stair training;Functional mobility training;Therapeutic activities;Therapeutic exercise;Balance training;Patient/family education    PT Goals (Current goals can be found in the Care Plan section)  Acute Rehab PT Goals Patient Stated Goal: to walk PT Goal Formulation: With patient Time For Goal Achievement: 08/01/22 Potential to Achieve Goals: Good    Frequency Min 5X/week     Co-evaluation               AM-PAC PT "6 Clicks" Mobility  Outcome Measure Help needed turning from your back to your side while in a flat bed without using bedrails?: None Help needed moving from lying on your back to sitting  on the side of a flat bed without using bedrails?: A Little Help needed moving to and from a bed to a chair (including a wheelchair)?: A Little Help needed standing up from a chair using your arms (e.g., wheelchair or bedside chair)?: A Little Help needed to walk in hospital room?: A Little Help needed climbing 3-5 steps with a railing? : A Little 6 Click Score: 19    End of Session Equipment Utilized During Treatment: Gait belt;Cervical collar Activity Tolerance: Patient tolerated treatment well Patient left: in chair;with call bell/phone within reach;Other (comment) (with OT) Nurse Communication: Mobility status PT Visit Diagnosis: Pain;Difficulty in walking, not elsewhere classified (R26.2);Other abnormalities of gait and mobility (R26.89) Pain - part of body:  (neck)    Time: 7062-3762 PT Time Calculation (min) (ACUTE ONLY): 22 min   Charges:   PT Evaluation $PT Eval Low Complexity: 1 Low          Lillia Pauls, PT, DPT Acute Rehabilitation Services Office [phone]   [name] 07/18/2022, 12:36 PM

## 2022-07-18 NOTE — TOC Transition Note (Addendum)
Transition of Care [facility]) - CM/SW Discharge Note   Patient Details  Name: Shannon Alexander MRN: [id number] Date of Birth: 07-Sep-1976  Transition of Care [facility]) CM/SW Contact:  Epifanio Lesches, RN Phone Number: 07/18/2022, 4:53 PM   Clinical Narrative:    Patient will DC to: home Anticipated DC date: 07/18/2022 Family notified: yes Transport by: car   - s/p ACDF C3-4 C4-5, 8/28. PMH spinal stenosis of cervical region; smoker  Per MD patient ready for DC today . RN, patient, and patient's family notified of DC. Order noted for DME RW. Referral made with [employer] for RW. RW will be delivered to bedside prior to d/c. Outpatient PT (when cleared by surgeon). Pt without Rx meds concerns. Post hospital f/u noted on AVS. Brother to provide transportation to home.  RNCM will sign off for now as intervention is no longer needed. Please consult [place] again if new needs arise.   Final next level of care: Home/Self Care Barriers to Discharge: No Barriers Identified   Patient Goals and CMS Choice     Choice offered to / list presented to : Patient  Discharge Placement     Discharge Plan and Services                DME Arranged: Walker rolling DME Agency: [employer] Date DME Agency Contacted: 07/18/22 Time DME Agency Contacted: [phone] Representative spoke with at DME Agency: Arnold Long            Social Determinants of Health (SDOH) Interventions     Readmission Risk Interventions     No data to display

## 2022-07-18 NOTE — Progress Notes (Signed)
Physical Therapy Treatment Patient Details Name: Shannon Alexander MRN: [id number] DOB: 1976/01/19 Today's Date: 07/18/2022   History of Present Illness 46 yo male s/p ACDF C3-4 C4-5 PMH spinal stenosis of cervical region; smoker    PT Comments    Pt continues to mobilize well. Session focused on gait and stair training prior to d/c home. Pt ambulating 250 ft with RW and negotiated 5 steps with railings to simulate home set up. Continues to demonstrate increased LLE circumduction, but able to correct somewhat with cueing. Provided visual demonstration of safe car transfer technique. Pt with no further questions/concerns. See below for recommendations.   Recommendations for follow up therapy are one component of a multi-disciplinary discharge planning process, led by the attending physician.  Recommendations may be updated based on patient status, additional functional criteria and insurance authorization.  Follow Up Recommendations  Outpatient PT (when cleared by surgeon)     Assistance Recommended at Discharge PRN  Patient can return home with the following Assistance with cooking/housework;Assist for transportation;Help with stairs or ramp for entrance   Equipment Recommendations  Rolling walker (2 wheels)    Recommendations for Other Services       Precautions / Restrictions Precautions Precautions: Cervical Precaution Booklet Issued: Yes (comment) Precaution Comments: JP drain Required Braces or Orthoses: Cervical Brace Cervical Brace: Soft collar Restrictions Weight Bearing Restrictions: No     Mobility  Bed Mobility Overal bed mobility: Modified Independent Bed Mobility: Rolling, Sidelying to Sit Rolling: Modified independent (Device/Increase time) Sidelying to sit: Supervision       General bed mobility comments: Cues for log roll technique    Transfers Overall transfer level: Modified independent Equipment used: Rolling walker (2 wheels) Transfers: Sit to/from  Stand Sit to Stand: Min guard           General transfer comment: Min guard for safety    Ambulation/Gait Ambulation/Gait assistance: Supervision Gait Distance (Feet): 250 Feet Assistive device: Rolling walker (2 wheels) Gait Pattern/deviations: Decreased stride length Gait velocity: decreased     General Gait Details: Cues for decreased LLE circumduction, one mild lateral LOB which pt self corrected   Stairs Stairs: Yes Stairs assistance: Supervision Stair Management: Two rails Number of Stairs: 5 General stair comments: Cues for step by step for descending for safety   Wheelchair Mobility    Modified Rankin (Stroke Patients Only)       Balance Overall balance assessment: Mild deficits observed, not formally tested                                          Cognition Arousal/Alertness: Awake/alert Behavior During Therapy: WFL for tasks assessed/performed Overall Cognitive Status: Within Functional Limits for tasks assessed                                 General Comments: pt reports no sleep so slower processing due to fatigue. pt could benefit from sleep to process education        Exercises      General Comments        Pertinent Vitals/Pain Pain Assessment Pain Assessment: Faces Faces Pain Scale: Hurts little more Pain Location: throat Pain Descriptors / Indicators: Sore Pain Intervention(s): Monitored during session    Home Living Family/patient expects to be discharged to:: Private residence Living Arrangements: Parent Available Help at Discharge:  Family Type of Home: House Home Access: Stairs to enter Entrance Stairs-Rails: Doctor, general practice of Steps: 3   Home Layout: One level Home Equipment: None      Prior Function            PT Goals (current goals can now be found in the care plan section) Acute Rehab PT Goals Patient Stated Goal: to walk PT Goal Formulation: With  patient Time For Goal Achievement: 08/01/22 Potential to Achieve Goals: Good Progress towards PT goals: Progressing toward goals    Frequency    Min 5X/week      PT Plan Current plan remains appropriate    Co-evaluation              AM-PAC PT "6 Clicks" Mobility   Outcome Measure  Help needed turning from your back to your side while in a flat bed without using bedrails?: None Help needed moving from lying on your back to sitting on the side of a flat bed without using bedrails?: None Help needed moving to and from a bed to a chair (including a wheelchair)?: None Help needed standing up from a chair using your arms (e.g., wheelchair or bedside chair)?: None Help needed to walk in hospital room?: A Little Help needed climbing 3-5 steps with a railing? : A Little 6 Click Score: 22    End of Session Equipment Utilized During Treatment: Gait belt;Cervical collar Activity Tolerance: Patient tolerated treatment well Patient left: with call bell/phone within reach;in bed Nurse Communication: Mobility status PT Visit Diagnosis: Pain;Difficulty in walking, not elsewhere classified (R26.2);Other abnormalities of gait and mobility (R26.89) Pain - part of body:  (neck)     Time: 6283-6629 PT Time Calculation (min) (ACUTE ONLY): 12 min  Charges:  $Gait Training: 8-22 mins                     Shannon Alexander, PT, DPT Acute Rehabilitation Services Office [phone]    Shannon Alexander 07/18/2022, 3:18 PM

## 2022-07-18 NOTE — Progress Notes (Signed)
Subjective: Doing well.  States that preop posterior neck/scapular pain is gone. "I feel different". Denies dysphagia with clear liquid diet and wants to advance. Has ambulated in hall with walker and PT.     Objective: Vital signs in last 24 hours: Temp:  [97.6 F (36.4 C)-99.2 F (37.3 C)] 99.2 F (37.3 C) (08/29 0856) Pulse Rate:  [62-84] 65 (08/29 0856) Resp:  [16-29] 19 (08/29 0856) BP: (100-138)/(57-94) 134/94 (08/29 0856) SpO2:  [93 %-100 %] 100 % (08/29 0856)  Intake/Output from previous day: 08/28 0701 - 08/29 0700 In: 1400 [I.V.:1300; IV Piggyback:100] Out: 1240 [Urine:400; Drains:800; Blood:20] Intake/Output this shift: No intake/output data recorded.  No results for input(s): "HGB" in the last 72 hours. No results for input(s): "WBC", "RBC", "HCT", "PLT" in the last 72 hours. No results for input(s): "NA", "K", "CL", "CO2", "BUN", "CREATININE", "GLUCOSE", "CALCIUM" in the last 72 hours. No results for input(s): "LABPT", "INR" in the last 72 hours.  Exam: Pleasant male, alert and oriented. NAD.  Wound looks good. No drainage or signs of infection.  Drain removed.  Bilat triceps and hip flexor strength has actually improved from my initial exam in office.     Assessment/Plan: Will advance diet to full clear. Continue PT. I advised patient that he has a long road ahead but I am very pleased with my exam findings this morning.  Will see how he is doing later this afternoon.       Zonia Kief 07/18/2022, 10:56 AM

## 2022-07-18 NOTE — Discharge Instructions (Signed)
Ok to shower 5 days postop.  Per Dr Ophelia Charter DO NOT REMOVE DRESSING AND OK TO SHOWER WITH DRESSING ON.   Cervical collar must be on at all times INCLUDING when showering.  No aggressive activity.   Do not bend or turn neck.     No driving  No lifting, pushing, pulling

## 2022-07-19 ENCOUNTER — Encounter (HOSPITAL_COMMUNITY): Payer: Self-pay | Admitting: Orthopaedic Surgery

## 2022-07-21 NOTE — Discharge Summary (Signed)
Patient ID: Shannon Alexander MRN: [id number] DOB/AGE: 04-26-1976 46 y.o.  Admit date: 07/17/2022 Discharge date: 07/18/2022  Admission Diagnoses:  Active Problems:   Spinal stenosis of cervical region   Discharge Diagnoses:  Active Problems:   Spinal stenosis of cervical region  status post Procedure(s): C3-4, C4-5 ANTERIOR CERVICAL DISCECTOMY FUSION, ALLOGRAFT AND PLATE  Past Medical History:  Diagnosis Date   Other spondylosis with myelopathy, cervical region 07/03/2022    Surgeries: Procedure(s): C3-4, C4-5 ANTERIOR CERVICAL DISCECTOMY FUSION, ALLOGRAFT AND PLATE on 03/28/3266   Consultants:   Discharged Condition: Improved  Hospital Course: Shannon Alexander is an 46 y.o. male who was admitted 07/17/2022 for operative treatment of cervical stenosis/myelopathy. Patient failed conservative treatments (please see the history and physical for the specifics) and had severe unremitting pain that affects sleep, daily activities and work/hobbies. After pre-op clearance, the patient was taken to the operating room on 07/17/2022 and underwent  Procedure(s): C3-4, C4-5 ANTERIOR CERVICAL DISCECTOMY FUSION, ALLOGRAFT AND PLATE.    Patient was given perioperative antibiotics:  Anti-infectives (From admission, onward)    Start     Dose/Rate Route Frequency Ordered Stop   07/17/22 0815  ceFAZolin (ANCEF) IVPB 2g/100 mL premix        2 g 200 mL/hr over 30 Minutes Intravenous On call to O.R. 07/17/22 0804 07/17/22 1050        Patient was given sequential compression devices and early ambulation to prevent DVT.   Patient benefited maximally from hospital stay and there were no complications. At the time of discharge, the patient was urinating/moving their bowels without difficulty, tolerating a regular diet, pain is controlled with oral pain medications and they have been cleared by PT/OT.   Recent vital signs: No data found.   Recent laboratory studies: No results for input(s): "WBC",  "HGB", "HCT", "PLT", "NA", "K", "CL", "CO2", "BUN", "CREATININE", "GLUCOSE", "INR", "CALCIUM" in the last 72 hours.  Invalid input(s): "PT", "2"   Discharge Medications:   Allergies as of 07/18/2022       Reactions   Bee Venom Itching        Medication List     TAKE these medications    methocarbamol 500 MG tablet Commonly known as: ROBAXIN Take 1 tablet (500 mg total) by mouth every 6 (six) hours as needed for muscle spasms. What changed: when to take this   oxyCODONE-acetaminophen 5-325 MG tablet Commonly known as: PERCOCET/ROXICET Take 1 tablet by mouth every 4 (four) hours as needed for severe pain.        Diagnostic Studies: DG Cervical Spine 2 or 3 views  Result Date: 07/17/2022 CLINICAL DATA:  C3 through C5 ACDF EXAM: CERVICAL SPINE - 2-3 VIEW COMPARISON:  MRI 06/21/2022 FINDINGS: Multiple C-arm images show performance of ACDF from C3 through C5. Interbody spacers. Anterior plate with screw fixation. No unexpected finding. IMPRESSION: ACDF C3 through C5. Electronically Signed   By: Paulina Fusi M.D.   On: 07/17/2022 13:23   DG C-Arm 1-60 Min-No Report  Result Date: 07/17/2022 Fluoroscopy was utilized by the requesting physician.  No radiographic interpretation.   DG C-Arm 1-60 Min-No Report  Result Date: 07/17/2022 Fluoroscopy was utilized by the requesting physician.  No radiographic interpretation.   MR Cervical Spine w/o contrast  Result Date: 06/21/2022 CLINICAL DATA:  Neck pain, left leg weakness with low back pain and left hand numbness EXAM: MRI CERVICAL SPINE WITHOUT CONTRAST TECHNIQUE: Multiplanar, multisequence MR imaging of the cervical spine was performed. No intravenous contrast was administered.  COMPARISON:  12/20/2020 FINDINGS: Alignment: Straightening of the normal cervical lordosis. No listhesis. Vertebrae: No acute fracture or suspicious osseous lesion. Redemonstrated Modic type 2 degenerative changes at C6 and C7, with increased Modic type 1  degenerative changes in C4 and C5. Congenitally short pedicles, which narrow the AP diameter of the spinal canal. Cord: Redemonstrated T2 hyperintense signal in the spinal cord bilaterally C4-C5 and right-greater-than-left at C3-C4, which appears unchanged, although the caliber of the cord appears slightly diminished in these locations compared to the prior exam. No new areas of T2 hyperintense signal. Otherwise normal signal and morphology in the cervical spinal cord. Posterior Fossa, vertebral arteries, paraspinal tissues: Previously noted cystic lesion in the left parotid gland is no longer definitively seen. Otherwise negative. Disc levels: C2-C3: No significant disc bulge. Left uncovertebral hypertrophy. No spinal canal stenosis. Mild left neural foraminal narrowing. C3-C4: Minimal disc bulge. Left uncovertebral hypertrophy. Ligamentum flavum hypertrophy. Mild spinal canal stenosis, with moderate to severe left neural foraminal narrowing, unchanged. C4-C5: Central disc protrusion. Facet and uncovertebral hypertrophy. Moderate to severe spinal canal stenosis and moderate bilateral neural foraminal narrowing, unchanged. C5-C6: Mild disc bulge. Facet and uncovertebral hypertrophy. Mild-to-moderate spinal canal stenosis with severe right and moderate to severe left neural foraminal narrowing, unchanged. C6-C7: Minimal disc bulge. Facet and uncovertebral hypertrophy. Mild-to-moderate spinal canal stenosis with moderate right and mild left neural foraminal narrowing, unchanged. C7-T1: Mild disc bulge with left paracentral/subarticular disc protrusion. Mild left neural foraminal narrowing. No spinal canal stenosis. IMPRESSION: 1. 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 No new areas of abnormal cord signal. 2. 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 3. Multilevel uncovertebral and facet arthropathy, which is worst at C5-C6, where it is severe on the right and moderate to severe on the left. Additional lesser neural foraminal narrowing, as described above. Electronically Signed   By: Wiliam Ke M.D.   On: 06/21/2022 18:45    Discharge Instructions     Incentive spirometry RT   Complete by: As directed         Follow-up Information     Eldred Manges, MD. Schedule an appointment as soon as possible for a visit today.   Specialty: Orthopedic Surgery Why: need return office visit with dr Ophelia Charter one week postop Contact information: [address] [place] [address] [phone]         Carlean Jews, NP Follow up.   Specialty: Family Medicine Contact information: [address] [place] [address] [phone]                 Discharge Plan:  discharge to home  Disposition:     Signed: Zonia Kief  07/21/2022, 1:44 PM

## 2022-07-25 ENCOUNTER — Ambulatory Visit (INDEPENDENT_AMBULATORY_CARE_PROVIDER_SITE_OTHER): Payer: Commercial Managed Care - HMO

## 2022-07-25 ENCOUNTER — Ambulatory Visit (INDEPENDENT_AMBULATORY_CARE_PROVIDER_SITE_OTHER): Payer: Commercial Managed Care - HMO | Admitting: Orthopaedic Surgery

## 2022-07-25 ENCOUNTER — Encounter: Payer: Self-pay | Admitting: Orthopaedic Surgery

## 2022-07-25 VITALS — BP 119/84 | HR 115 | Ht 69.0 in | Wt 280.0 lb

## 2022-07-25 DIAGNOSIS — Z981 Arthrodesis status: Secondary | ICD-10-CM | POA: Diagnosis not present

## 2022-07-25 NOTE — Progress Notes (Signed)
   Post-Op Visit Note   Patient: Shannon Alexander           Date of Birth: 1976/06/05           MRN: [id number] Visit Date: 07/25/2022 PCP: Carlean Jews, NP   Assessment & Plan post two-level cervical fusion C3-4 C4-5 with preop myelopathy present.  Patient went home using a walker he is now walking without the walker.  His gait is actually improved from preop but he still has some limited ankle flexion and slight internal rotation.  Still has some tingling in his left hand and arm but states it feels stronger than he did preop.  2 x-rays show graft in good position.  New collar covers given continue collar return 5 weeks.  Chief Complaint:  Chief Complaint  Patient presents with   Neck - Routine Post Op    07/17/2022 C3-4, C4-5 ACDF   Visit Diagnoses:  1. Status post cervical spinal fusion     Plan: Patient with some dysphagia new collar covers given incision looks good Steri-Strips changed.  Return 5 weeks for lateral flexion-extension C-spine x-ray.  Follow-Up Instructions: No follow-ups on file.   Orders:  Orders Placed This Encounter  Procedures   XR Cervical Spine 2 or 3 views   No orders of the defined types were placed in this encounter.   Imaging: No results found.  PMFS History: Patient Active Problem List   Diagnosis Date Noted   S/P cervical spinal fusion 07/17/2022   Spinal stenosis of cervical region 07/03/2022   Other spondylosis with myelopathy, cervical region 07/03/2022   Past Medical History:  Diagnosis Date   Other spondylosis with myelopathy, cervical region 07/03/2022    No family history on file.  Past Surgical History:  Procedure Laterality Date   ANTERIOR CERVICAL DECOMP/DISCECTOMY FUSION N/A 07/17/2022   Procedure: C3-4, C4-5 ANTERIOR CERVICAL DISCECTOMY FUSION, ALLOGRAFT AND PLATE;  Surgeon: Eldred Manges, MD;  Location: [facility] OR;  Service: Orthopedics;  Laterality: N/A;   INCISION AND DRAINAGE     when 46 years old d/t ingrown hair on back  of head   Social History   Occupational History   Not on file  Tobacco Use   Smoking status: Every Day    Packs/day: 0.50    Years: 15.00    Total pack years: 7.50    Types: Cigarettes   Smokeless tobacco: Not on file  Vaping Use   Vaping Use: Never used  Substance and Sexual Activity   Alcohol use: No   Drug use: No   Sexual activity: Not Currently    Birth control/protection: None

## 2022-08-29 ENCOUNTER — Encounter: Payer: Commercial Managed Care - HMO | Admitting: Orthopaedic Surgery

## 2022-09-19 ENCOUNTER — Encounter: Payer: Commercial Managed Care - HMO | Admitting: Orthopaedic Surgery
